# Patient Record
Sex: Female | Born: 1965 | Race: Black or African American | Hispanic: No | Marital: Married | Smoking: Current some day smoker
Health system: Southern US, Community
[De-identification: ages and names within clinical notes are randomized; demographics above are authoritative.]

## PROBLEM LIST (undated history)

## (undated) ENCOUNTER — Ambulatory Visit: Discharge status: Absent without leave | Payer: 59

## (undated) DIAGNOSIS — E039 Hypothyroidism, unspecified: Secondary | ICD-10-CM

## (undated) HISTORY — DX: Hypothyroidism, unspecified: E03.9

---

## 1998-03-06 ENCOUNTER — Encounter: Payer: Self-pay | Admitting: Family Medicine

## 1998-03-06 LAB — CONVERTED CEMR LAB
Blood Glucose, Fasting: 87 mg/dL
RBC count: 13.6 10*6/uL
TSH: 18.25 microintl units/mL
WBC, blood: 4.58 10*3/uL

## 1998-05-03 ENCOUNTER — Encounter: Payer: Self-pay | Admitting: Family Medicine

## 1998-05-03 LAB — CONVERTED CEMR LAB: Pap Smear: ABNORMAL

## 1998-05-17 ENCOUNTER — Other Ambulatory Visit: Admission: RE | Admit: 1998-05-17 | Discharge: 1998-05-17 | Payer: Self-pay | Admitting: Family Medicine

## 1998-05-18 ENCOUNTER — Encounter: Payer: Self-pay | Admitting: Family Medicine

## 1998-05-18 LAB — CONVERTED CEMR LAB: TSH: 14.22 microintl units/mL

## 1998-09-07 ENCOUNTER — Encounter: Payer: Self-pay | Admitting: Family Medicine

## 1998-09-07 LAB — CONVERTED CEMR LAB: Pap Smear: ABNORMAL

## 1998-10-08 ENCOUNTER — Encounter: Payer: Self-pay | Admitting: Family Medicine

## 1998-10-08 LAB — CONVERTED CEMR LAB: TSH: 2 microintl units/mL

## 1998-10-29 HISTORY — PX: PAROTIDECTOMY: SUR1003

## 1999-04-09 ENCOUNTER — Encounter: Payer: Self-pay | Admitting: Family Medicine

## 1999-04-09 LAB — CONVERTED CEMR LAB
Hgb A1c MFr Bld: 5.8 %
TSH: 1.72 microintl units/mL

## 1999-10-11 ENCOUNTER — Other Ambulatory Visit: Admission: RE | Admit: 1999-10-11 | Discharge: 1999-10-11 | Payer: Self-pay | Admitting: Family Medicine

## 1999-10-11 ENCOUNTER — Encounter: Payer: Self-pay | Admitting: Family Medicine

## 1999-10-11 LAB — CONVERTED CEMR LAB: Pap Smear: NORMAL

## 2000-02-02 ENCOUNTER — Emergency Department (HOSPITAL_COMMUNITY): Admission: EM | Admit: 2000-02-02 | Discharge: 2000-02-02 | Payer: Self-pay | Admitting: Emergency Medicine

## 2000-05-12 ENCOUNTER — Encounter: Payer: Self-pay | Admitting: Family Medicine

## 2001-06-11 ENCOUNTER — Encounter: Payer: Self-pay | Admitting: Family Medicine

## 2001-06-11 LAB — CONVERTED CEMR LAB
TSH: 7.75 microintl units/mL
WBC, blood: 7.9 10*3/uL

## 2001-08-13 ENCOUNTER — Encounter: Payer: Self-pay | Admitting: Family Medicine

## 2001-08-13 LAB — CONVERTED CEMR LAB: WBC, blood: 7.6 10*3/uL

## 2002-02-03 ENCOUNTER — Encounter: Payer: Self-pay | Admitting: Family Medicine

## 2002-02-03 LAB — CONVERTED CEMR LAB
RBC count: 4.47 10*6/uL
TSH: 6.83 microintl units/mL
WBC, blood: 7.2 10*3/uL

## 2002-05-06 ENCOUNTER — Encounter: Payer: Self-pay | Admitting: Family Medicine

## 2002-08-19 ENCOUNTER — Encounter: Payer: Self-pay | Admitting: Family Medicine

## 2002-08-19 LAB — CONVERTED CEMR LAB
Blood Glucose, Fasting: 112 mg/dL
TSH: 1.76 microintl units/mL

## 2003-01-11 ENCOUNTER — Emergency Department (HOSPITAL_COMMUNITY): Admission: EM | Admit: 2003-01-11 | Discharge: 2003-01-11 | Payer: Self-pay | Admitting: Emergency Medicine

## 2003-04-08 ENCOUNTER — Emergency Department (HOSPITAL_COMMUNITY): Admission: EM | Admit: 2003-04-08 | Discharge: 2003-04-08 | Payer: Self-pay | Admitting: Emergency Medicine

## 2003-04-08 ENCOUNTER — Encounter: Payer: Self-pay | Admitting: Emergency Medicine

## 2004-05-09 ENCOUNTER — Encounter: Payer: Self-pay | Admitting: Family Medicine

## 2004-05-09 ENCOUNTER — Other Ambulatory Visit: Admission: RE | Admit: 2004-05-09 | Discharge: 2004-05-09 | Payer: Self-pay

## 2004-05-09 LAB — CONVERTED CEMR LAB
Blood Glucose, Fasting: 90 mg/dL
Pap Smear: ABNORMAL
RBC count: 4.28 10*6/uL
TSH: 7.66 microintl units/mL
WBC, blood: 6.3 10*3/uL

## 2004-06-25 ENCOUNTER — Encounter: Payer: Self-pay | Admitting: Family Medicine

## 2004-06-25 LAB — CONVERTED CEMR LAB: TSH: 7.03 u[IU]/mL

## 2004-07-12 HISTORY — PX: ABDOMINAL HYSTERECTOMY: SHX81

## 2004-08-09 ENCOUNTER — Encounter: Payer: Self-pay | Admitting: Family Medicine

## 2004-08-09 LAB — CONVERTED CEMR LAB: RBC count: 4.43 10*6/uL

## 2004-12-06 ENCOUNTER — Ambulatory Visit: Payer: Self-pay | Admitting: Family Medicine

## 2004-12-09 ENCOUNTER — Encounter: Payer: Self-pay | Admitting: Family Medicine

## 2004-12-19 ENCOUNTER — Ambulatory Visit: Payer: Self-pay | Admitting: Family Medicine

## 2005-10-12 ENCOUNTER — Emergency Department (HOSPITAL_COMMUNITY): Admission: EM | Admit: 2005-10-12 | Discharge: 2005-10-12 | Payer: Self-pay | Admitting: Emergency Medicine

## 2005-11-01 ENCOUNTER — Emergency Department: Payer: Self-pay | Admitting: Emergency Medicine

## 2005-12-02 ENCOUNTER — Emergency Department: Payer: Self-pay | Admitting: Emergency Medicine

## 2005-12-11 ENCOUNTER — Ambulatory Visit: Payer: Self-pay | Admitting: Family Medicine

## 2005-12-11 LAB — CONVERTED CEMR LAB: TSH: 3.62 microintl units/mL

## 2005-12-15 ENCOUNTER — Ambulatory Visit: Payer: Self-pay | Admitting: Family Medicine

## 2007-07-26 ENCOUNTER — Emergency Department: Payer: Self-pay | Admitting: Emergency Medicine

## 2008-06-11 ENCOUNTER — Emergency Department: Payer: Self-pay | Admitting: Emergency Medicine

## 2009-04-02 ENCOUNTER — Emergency Department: Payer: Self-pay | Admitting: Emergency Medicine

## 2009-04-05 ENCOUNTER — Encounter: Payer: Self-pay | Admitting: Family Medicine

## 2009-04-05 ENCOUNTER — Ambulatory Visit: Payer: Self-pay | Admitting: Family Medicine

## 2009-04-05 ENCOUNTER — Other Ambulatory Visit: Admission: RE | Admit: 2009-04-05 | Discharge: 2009-04-05 | Payer: Self-pay | Admitting: Family Medicine

## 2009-04-05 DIAGNOSIS — E785 Hyperlipidemia, unspecified: Secondary | ICD-10-CM | POA: Insufficient documentation

## 2009-04-05 DIAGNOSIS — E039 Hypothyroidism, unspecified: Secondary | ICD-10-CM | POA: Insufficient documentation

## 2009-04-05 DIAGNOSIS — R5381 Other malaise: Secondary | ICD-10-CM | POA: Insufficient documentation

## 2009-04-05 DIAGNOSIS — D485 Neoplasm of uncertain behavior of skin: Secondary | ICD-10-CM | POA: Insufficient documentation

## 2009-04-05 DIAGNOSIS — R5383 Other fatigue: Secondary | ICD-10-CM

## 2009-04-05 LAB — CONVERTED CEMR LAB
Basophils Relative: 0.9 % (ref 0.0–3.0)
Eosinophils Relative: 2 % (ref 0.0–5.0)
GFR calc non Af Amer: 87.02 mL/min (ref >60)
Glucose, Bld: 86 mg/dL (ref 70–99)
HCT: 38.5 % (ref 36.0–46.0)
Hemoglobin: 12.9 g/dL (ref 12.0–15.0)
Lymphocytes Relative: 18.8 % (ref 12.0–46.0)
Lymphs Abs: 1.8 10*3/uL (ref 0.7–4.0)
Monocytes Relative: 5.6 % (ref 3.0–12.0)
Neutro Abs: 6.8 10*3/uL (ref 1.4–7.7)
Potassium: 3.9 meq/L (ref 3.5–5.1)
RBC: 4.37 M/uL (ref 3.87–5.11)
RDW: 13 % (ref 11.5–14.6)
Sodium: 141 meq/L (ref 135–145)

## 2009-04-09 ENCOUNTER — Encounter (INDEPENDENT_AMBULATORY_CARE_PROVIDER_SITE_OTHER): Payer: Self-pay | Admitting: *Deleted

## 2009-05-03 ENCOUNTER — Ambulatory Visit: Payer: Self-pay | Admitting: Family Medicine

## 2009-05-09 ENCOUNTER — Ambulatory Visit: Payer: Self-pay | Admitting: Family Medicine

## 2009-11-15 ENCOUNTER — Ambulatory Visit: Payer: Self-pay | Admitting: Family Medicine

## 2009-12-06 ENCOUNTER — Encounter: Payer: Self-pay | Admitting: Family Medicine

## 2010-01-16 ENCOUNTER — Telehealth: Payer: Self-pay | Admitting: Family Medicine

## 2010-03-29 ENCOUNTER — Ambulatory Visit: Payer: Self-pay | Admitting: Family Medicine

## 2010-04-01 LAB — CONVERTED CEMR LAB
Free T4: 0.8 ng/dL (ref 0.6–1.6)
T3, Free: 2.2 pg/mL — ABNORMAL LOW (ref 2.3–4.2)
TSH: 11.35 microintl units/mL — ABNORMAL HIGH (ref 0.35–5.50)

## 2010-04-29 ENCOUNTER — Ambulatory Visit: Payer: Self-pay | Admitting: Family Medicine

## 2010-04-29 LAB — CONVERTED CEMR LAB
Albumin: 3.8 g/dL (ref 3.5–5.2)
Alkaline Phosphatase: 59 units/L (ref 39–117)
Basophils Absolute: 0 10*3/uL (ref 0.0–0.1)
Basophils Relative: 0.5 % (ref 0.0–3.0)
Bilirubin, Direct: 0.1 mg/dL (ref 0.0–0.3)
CO2: 30 meq/L (ref 19–32)
Calcium: 9 mg/dL (ref 8.4–10.5)
Chloride: 105 meq/L (ref 96–112)
Cholesterol: 191 mg/dL (ref 0–200)
Creatinine, Ser: 1 mg/dL (ref 0.4–1.2)
Eosinophils Absolute: 0.3 10*3/uL (ref 0.0–0.7)
Free T4: 1.2 ng/dL (ref 0.6–1.6)
Glucose, Bld: 92 mg/dL (ref 70–99)
HDL: 50.7 mg/dL (ref >39.00)
Lymphocytes Relative: 26.1 % (ref 12.0–46.0)
MCHC: 33.5 g/dL (ref 30.0–36.0)
MCV: 87.6 fL (ref 78.0–100.0)
Monocytes Absolute: 0.4 10*3/uL (ref 0.1–1.0)
Neutro Abs: 5.1 10*3/uL (ref 1.4–7.7)
Neutrophils Relative %: 64.4 % (ref 43.0–77.0)
RBC: 4.43 M/uL (ref 3.87–5.11)
RDW: 14.2 % (ref 11.5–14.6)
Total Protein: 6.7 g/dL (ref 6.0–8.3)
Triglycerides: 85 mg/dL (ref 0.0–149.0)

## 2010-04-30 LAB — CONVERTED CEMR LAB: Vit D, 25-Hydroxy: 20 ng/mL — ABNORMAL LOW (ref 30–89)

## 2010-05-02 ENCOUNTER — Ambulatory Visit: Payer: Self-pay | Admitting: Family Medicine

## 2010-05-02 DIAGNOSIS — J0100 Acute maxillary sinusitis, unspecified: Secondary | ICD-10-CM

## 2010-06-24 ENCOUNTER — Emergency Department: Payer: Self-pay | Admitting: Unknown Physician Specialty

## 2010-07-16 ENCOUNTER — Ambulatory Visit: Payer: Self-pay | Admitting: Family Medicine

## 2010-07-17 LAB — CONVERTED CEMR LAB: TSH: 5.48 microintl units/mL (ref 0.35–5.50)

## 2010-08-01 ENCOUNTER — Encounter (INDEPENDENT_AMBULATORY_CARE_PROVIDER_SITE_OTHER): Payer: Self-pay | Admitting: *Deleted

## 2010-11-15 ENCOUNTER — Emergency Department: Payer: Self-pay | Admitting: Emergency Medicine

## 2010-11-19 ENCOUNTER — Ambulatory Visit: Payer: Self-pay | Admitting: Family Medicine

## 2010-12-17 ENCOUNTER — Ambulatory Visit: Payer: Self-pay | Admitting: Family Medicine

## 2010-12-19 LAB — CONVERTED CEMR LAB
Free T4: 0.93 ng/dL (ref 0.60–1.60)
TSH: 1.04 microintl units/mL (ref 0.35–5.50)

## 2011-01-28 NOTE — Progress Notes (Signed)
Summary: Rx Synthroid  Phone Note Refill Request Message from:  Patient on January 16, 2010 12:55 PM  Refills Requested: Medication #1:  SYNTHROID 112 MCG TABS 1 every  day by mouth. Patient request refill on Synthroid.  Will take her last pill today.  Has not had labs done since 04/2009, last appt 03/2009.  Please advise.  [facility]   Method Requested: Electronic Initial call taken by: Linde Gillis CMA Duncan Dull),  January 16, 2010 12:56 PM  Follow-up for Phone Call        See me during May with Lab prior.  Follow-up by: Shaune Leeks MD,  January 16, 2010 1:23 PM  Additional Follow-up for Phone Call Additional follow up Details #1::        Left message on voicemail.  Advised patient that Rx for Synthroid was sent to pharmacy and that Dr. Hetty Ely would like to see her in May with labs prior.  Advised her to call back and schedule the appt at her convenience.    Additional Follow-up by: Linde Gillis CMA Duncan Dull),  January 16, 2010 2:43 PM    Prescriptions: SYNTHROID 112 MCG TABS (LEVOTHYROXINE SODIUM) 1 every  day by mouth  #90 x 3   Entered and Authorized by:   Shaune Leeks MD   Signed by:   Shaune Leeks MD on 01/16/2010   Method used:   [facility]        [id number]       [address]       [place], [place]  [address]       Ph: [phone] or [phone]       Fax: [phone]   RxID:   [id number]

## 2011-01-28 NOTE — Letter (Signed)
Summary: Shannon Hansen letter  [place] [facility]  [address] [place], [place] [address]   Phone: [phone]  Fax: [phone]       08/01/2010 MRN: [id number]  [facility] [address] [place], [place]  [address]  Dear Ms. Shannon [facility] [place] [place] announce the retirement of Arta Silence, M.D., from full-time practice at the [facility] office effective June 27, 2010 and his plans of returning part-time.  It is important to Dr. Hetty Ely and to our practice that you understand that [facility] has seven physicians in our office for your health care needs.  We will continue to offer the same exceptional care that you have today.    Dr. Hetty Ely has spoken to many of you about his plans for retirement and returning part-time in the fall.   We will continue to work with you through the transition to schedule appointments for you in the office and meet the high standards that [facility] is committed to.   Again, it is with great pleasure that we share the news that Dr. Hetty Ely will return to [facility] in October of 2011 with a reduced schedule.    If you have any questions, or would like to request an appointment with one of our physicians, please call us at [phone] and press the option for Scheduling an appointment.  We take pleasure in providing you with excellent patient care and look forward to seeing you at your next office visit.  Our [facility] Physicians are:  Tillman Abide, M.D. Laurita Quint, M.D. Roxy Manns, M.D. Kerby Nora, M.D. Hannah Beat, M.D. Ruthe Mannan, M.D. We proudly welcomed Raechel Ache, M.D. and Eustaquio Boyden, M.D. to the practice in July/August 2011.  Sincerely,  [facility]

## 2011-01-28 NOTE — Assessment & Plan Note (Signed)
Summary: CANT TASTE OR SMELL   Vital Signs:  Patient profile:   45 year old female Height:      66 inches Weight:      191.25 pounds BMI:     30.98 Temp:     98.3 degrees F oral Pulse rate:   80 / minute Pulse rhythm:   regular BP sitting:   120 / 78  (left arm) Cuff size:   regular  Vitals Entered By: Delilah Shan CMA Duncan Dull) (March 29, 2010 9:03 AM) CC: Can't taste or smell.    History of Present Illness: 45 yo here with congestion x 2 weeks. Started out with sinus congestion, not really draining, just feeling congested. Last few days feels she is so congested she cannot taste or smell things like she normally does. No fevers or chills. Tickle in her throat and laryngitis, but no cough. No SOB or wheezing.    prone to vaginal yeast infections with abx.  Hypothyroidism- brings her labs from work, taken in December, TSH was 10.42.  Currently taking Synthroid 112 micrograms and wants to make sure that her thyroid is ok.  Has been feeling a little more fatigued and has gained weight.  No changes in bowels.  Mood has been a little low,but she says she is not depressed.    Current Medications (verified): 1)  Synthroid 112 Mcg Tabs (Levothyroxine Sodium) .Marland Kitchen.. 1 Every  Day By Mouth 2)  Amoxicillin 500 Mg Tabs (Amoxicillin) .Marland Kitchen.. 1 Tab By Mouth Two Times A Day X 10 Days 3)  Diflucan 150 Mg Tabs (Fluconazole) .... Once Daily  Allergies (verified): No Known Drug Allergies  Review of Systems      See HPI General:  Denies chills and fever. ENT:  Complains of nasal congestion, sinus pressure, and sore throat; denies difficulty swallowing. CV:  Denies chest pain or discomfort. Resp:  Denies cough. GI:  Denies abdominal pain. Derm:  Denies rash. Psych:  Denies anxiety, depression, suicidal thoughts/plans, thoughts of violence, unusual visions or sounds, and thoughts /plans of harming others.  Physical Exam  General:  Well-developed,well-nourished,in no acute distress;  alert,appropriate and cooperative throughout examination, slightly overwieght. Eyes:  Conjunctiva clear bilaterally.  Ears:  mod clear fluid bilaterally Nose:  mucosal erythema and mucosal edema.   no airflow obstruciton. sinuses +/- tender Mouth:  pharyngeal erythema.   Lungs:  Normal respiratory effort, chest expands symmetrically. Lungs are clear to auscultation, no crackles or wheezes. Heart:  Normal rate and regular rhythm. S1 and S2 normal without gallop, murmur, click, rub or other extra sounds. Extremities:  No clubbing, cyanosis, edema, or deformity noted with normal full range of motion of all joints.   Psych:  Cognition and judgment appear intact. Alert and cooperative with normal attention span and concentration. No apparent delusions, illusions, hallucinations   Impression & Recommendations:  Problem # 1:  OTHER ACUTE SINUSITIS (ICD-461.8) Assessment New Given duration and progression of symptoms, will treat with 10 day course of amoxicllin.  See pt instructions for details. Her updated medication list for this problem includes:    Amoxicillin 500 Mg Tabs (Amoxicillin) .Marland Kitchen... 1 tab by mouth two times a day x 10 days  Problem # 2:  HYPOTHYROIDISM (ICD-244.9) Assessment: Deteriorated Will recheck Thyroid studies.  Continue current dose for now.  will scan labs from her work. Her updated medication list for this problem includes:    Synthroid 112 Mcg Tabs (Levothyroxine sodium) .Marland Kitchen... 1 every  day by mouth  Orders: Venipuncture (04540)  TLB-TSH (Thyroid Stimulating Hormone) (84443-TSH) TLB-T3, Free (Triiodothyronine) (84481-T3FREE) TLB-T4 (Thyrox), Free [phone])  Complete Medication List: 1)  Synthroid 112 Mcg Tabs (Levothyroxine sodium) .Marland Kitchen.. 1 every  day by mouth 2)  Amoxicillin 500 Mg Tabs (Amoxicillin) .Marland Kitchen.. 1 tab by mouth two times a day x 10 days 3)  Diflucan 150 Mg Tabs (Fluconazole) .... Once daily  Patient Instructions: 1)  Take amoxicillin as directed.  Drink  lots of fluids.  Treat sympotmatically with Mucinex, nasal saline irrigation, and Tylenol/Ibuprofen. Also try claritin D or zyrtec D over the counter- two times a day as needed ( have to sign for them at pharmacy). You can use warm compresses.  Call if not improving as expected in 5-7 days.  Prescriptions: DIFLUCAN 150 MG TABS (FLUCONAZOLE) once daily  #1 x 0   Entered and Authorized by:   Ruthe Mannan MD   Signed by:   Ruthe Mannan MD on 03/29/2010   Method used:   [facility]        [id number]       [address]       [place], [place]  [address]       Ph: [phone] or [phone]       Fax: [phone]   RxID:   [id number] AMOXICILLIN 500 MG TABS (AMOXICILLIN) 1 tab by mouth two times a day x 10 days  #20 x 0   Entered and Authorized by:   Ruthe Mannan MD   Signed by:   Ruthe Mannan MD on 03/29/2010   Method used:   [facility]        [id number]       [address]       [place], [place]  [address]       Ph: [phone] or [phone]       Fax: [phone]   RxID:   [id number]   Current Allergies (reviewed today): No known allergies

## 2011-01-28 NOTE — Assessment & Plan Note (Signed)
Summary: CPX/CLE   Vital Signs:  Patient profile:   45 year old female Weight:      192.50 pounds Temp:     99.0 degrees F oral Pulse rate:   80 / minute Pulse rhythm:   regular BP sitting:   108 / 72  (left arm) Cuff size:   regular  Vitals Entered By: Sydell Axon LPN (May 03, 5783 8:13 AM) CC: 30 Minute checkup, has had a hysterectomy   History of Present Illness: Pt here for Comp Exam. She had Hyst for fibroids. She had one abnml Pap in the past. She still has her ovaries and thought she might have had a problem. He left her ovaries so assumed they were ok.  She has no complaints except pain in the neck area. Her taste came back but now is gone again. She has had a place on her rectal area that swells at times and is sore and then spontaneously drains...curr decompressed. Has been going on for a few months. Pt says she had mammo in Feb, no evidence in chart.  Preventive Screening-Counseling & Management  Alcohol-Tobacco     Alcohol drinks/day: <1     Alcohol type: vodka and OJ     Smoking Status: never     Smoking Cessation Counseling: yes     Packs/Day: <0.25  Caffeine-Diet-Exercise     Caffeine use/day: >5     Does Patient Exercise: yes     Type of exercise: walking and gym     Exercise (avg: min/session): 30-60     Times/week: 3  Problems Prior to Update: 1)  Other Acute Sinusitis  (ICD-461.8) 2)  Hyperlipidemia  (ICD-272.4) 3)  Health Maintenance Exam  (ICD-V70.0) 4)  Neoplasm of Uncertain Behavior of Skin  (ICD-238.2) 5)  Fatigue  (ICD-780.79) 6)  Hypothyroidism  (ICD-244.9)  Medications Prior to Update: 1)  Diflucan 150 Mg Tabs (Fluconazole) .... Once Daily 2)  Synthroid 125 Mcg Tabs (Levothyroxine Sodium) .Marland Kitchen.. 1 Tab By Mouth Daily  Allergies: No Known Drug Allergies  Past History:  Past Surgical History: Last updated: 04/05/2009 PARTIAL HYSTERECTOMY ;DYSMENNORHEA :( DR. Max Sane) 07/12/2004 LEFT PAROTIDECTOMY (DR CERAME)  (10/1998)  Family  History: Last updated: 05/02/2010 Father A 71 LABILE HTN   DM   THYR DZ   CHOL  (Coy Saunas) Mother: A 68  PALPITATIONS Brother A 50 Brother A 49 DM Brother A 48 Sister A 47 CV: NEGATIVE HBP: POSITIVE FATHER DM: POSITIVE FATHER GOUT/ARTHRITIS: PROSTATE CANCER BREAST/OVARIAN/UTERINE CANCER: POSITIVE MGM// AND AUNT/ PANCREAS AND LIVER CANCER ALSO COLON CANCER: POSITVE LIVER AND PANCREAS AUNT DEPRESSION: NEGATIVE ETOH/DRUG ABUSE:NEGATIVE ETOH/DRUG ABUSE:: NEGATIVE OTHER: NEGATIVE STROKE  Social History: Last updated: 04/05/2009 Marital Status Children: 1 DAUGHTER Occupation: MED LAB TECH  Risk Factors: Alcohol Use: <1 (05/02/2010) Caffeine Use: >5 (05/02/2010) Exercise: yes (05/02/2010)  Risk Factors: Smoking Status: never (05/02/2010) Packs/Day: <0.25 (05/02/2010)  Family History: Father A 71 LABILE HTN   DM   THYR DZ   CHOL  ([employer]) Mother: A 68  PALPITATIONS Brother A 50 Brother A 49 DM Brother A 48 Sister A 47 CV: NEGATIVE HBP: POSITIVE FATHER DM: POSITIVE FATHER GOUT/ARTHRITIS: PROSTATE CANCER BREAST/OVARIAN/UTERINE CANCER: POSITIVE MGM// AND AUNT/ PANCREAS AND LIVER CANCER ALSO COLON CANCER: POSITVE LIVER AND PANCREAS AUNT DEPRESSION: NEGATIVE ETOH/DRUG ABUSE:NEGATIVE ETOH/DRUG ABUSE:: NEGATIVE OTHER: NEGATIVE STROKE  Social History: Caffeine use/day:  >5  Review of Systems General:  Complains of fatigue and sweats; denies chills, fever, weakness, and weight loss; night sweats and  occas fatigue. Eyes:  Denies blurring, discharge, and eye pain. ENT:  Denies decreased hearing, ear discharge, earache, and ringing in ears. CV:  Denies chest pain or discomfort, fainting, fatigue, palpitations, shortness of breath with exertion, swelling of feet, and swelling of hands. Resp:  Denies cough, shortness of breath, and wheezing. GI:  Denies abdominal pain, bloody stools, change in bowel habits, constipation, dark tarry stools, diarrhea, indigestion,  loss of appetite, nausea, vomiting, vomiting blood, and yellowish skin color. GU:  Denies discharge, dysuria, nocturia, and urinary frequency. MS:  Complains of muscle aches; denies joint pain, low back pain, cramps, and stiffness; neck. Derm:  Denies dryness, itching, and rash. Neuro:  Denies numbness, poor balance, tingling, and tremors.  Physical Exam  General:  Well-developed,well-nourished,in no acute distress; alert,appropriate and cooperative throughout examination, slightly overwieght. Head:  Normocephalic and atraumatic without obvious abnormalities. No apparent alopecia or balding. Sinuses minimally tender max. Eyes:  Conjunctiva clear bilaterally.  Ears:  External ear exam shows no significant lesions or deformities.  Otoscopic examination reveals clear canals, tympanic membranes are intact bilaterally without bulging, retraction, inflammation or discharge. Hearing is grossly normal bilaterally. Nose:  mucosal erythema and mucosal edema.   no airflow obstruciton. sinuses +/- tender max Mouth:  Oral mucosa and oropharynx without lesions or exudates.  Teeth in good repair. Neck:  No deformities, masses, or tenderness noted. Chest Wall:  No deformities, masses, or tenderness noted. Breasts:  No mass, nodules, thickening, tenderness, bulging, retraction, inflamation, nipple discharge or skin changes noted.   Lungs:  Normal respiratory effort, chest expands symmetrically. Lungs are clear to auscultation, no crackles or wheezes. Heart:  Normal rate and regular rhythm. S1 and S2 normal without gallop, murmur, click, rub or other extra sounds. Abdomen:  Bowel sounds positive,abdomen soft and non-tender without masses, organomegaly or hernias noted. Rectal:  No external abnormalities noted. Normal sphincter tone. No rectal masses or tenderness. G neg. Genitalia:  Bimanual only done Introitus wnl, Uterus and Cervix absent, Adnexa nontender w/o mass, ovaries not felt.  Msk:  No deformity  or scoliosis noted of thoracic or lumbar spine.   Pulses:  R and L carotid,radial,femoral,dorsalis pedis and posterior tibial pulses are full and equal bilaterally Extremities:  No clubbing, cyanosis, edema, or deformity noted with normal full range of motion of all joints.   Neurologic:  No cranial nerve deficits noted. Station and gait are normal. Sensory, motor and coordinative functions appear intact. Skin:  Intact without suspicious lesions or rashes except small maculopapular 8mm lesion on left perineal area just posterior tyo rectal/anal verge, decompressed at present.   Impression & Recommendations:  Problem # 1:  HEALTH MAINTENANCE EXAM (ICD-V70.0) Assessment Comment Only Discusssed immun, wellness checks and Pap and Mammos.  Problem # 2:  ABSCESS, PERINEAL (ICD-682.9) Assessment: New  Use heat as often as poss. Start AB. Her updated medication list for this problem includes:    Augmentin 500-125 Mg Tabs (Amoxicillin-pot clavulanate) ..... One tab by mouth three times a day for 2 weeks  Elevate affected area. Warm moist compresses for 20 minutes every 2 hours while awake. Take antibiotics as directed and take acetaminophen as needed. To be seen in 48-72 hours if no improvement, sooner if worse.  Problem # 3:  HYPERLIPIDEMIA (ICD-272.4) Assessment: Unchanged  STable. Weight loss will help. She is planning on starting.  Labs Reviewed: SGOT: 14 (04/29/2010)   SGPT: 12 (04/29/2010)   HDL:50.70 (04/29/2010)  LDL:123 (04/29/2010)  Chol:191 (04/29/2010)  Trig:85.0 (04/29/2010)  Problem # 4:  FATIGUE (ICD-780.79) Assessment: Unchanged No lab evidence. Start exercise.  Problem # 5:  HYPOTHYROIDISM (ICD-244.9) Assessment: Unchanged Stable and adequately replaced. Cont curr dose. Her updated medication list for this problem includes:    Synthroid 125 Mcg Tabs (Levothyroxine sodium) .Marland Kitchen... 1 tab by mouth daily  Labs Reviewed: TSH: 4.41 (04/29/2010)    HgBA1c: 5.8  (04/09/1999) Chol: 191 (04/29/2010)   HDL: 50.70 (04/29/2010)   LDL: 123 (04/29/2010)   TG: 85.0 (04/29/2010)  Complete Medication List: 1)  Synthroid 125 Mcg Tabs (Levothyroxine sodium) .Marland Kitchen.. 1 tab by mouth daily 2)  Augmentin 500-125 Mg Tabs (Amoxicillin-pot clavulanate) .... One tab by mouth three times a day for 2 weeks 3)  Diflucan 100 Mg Tabs (Fluconazole) .... One tab in 7 days another at end of abs  Patient Instructions: 1)  RTC if sxs don't resolve. Prescriptions: SYNTHROID 125 MCG TABS (LEVOTHYROXINE SODIUM) 1 tab by mouth daily  #30 x 12   Entered and Authorized by:   Shaune Leeks MD   Signed by:   Shaune Leeks MD on 05/02/2010   Method used:   [facility]        [id number]       [address]       [place], [place]  [address]       Ph: [phone] or [phone]       Fax: [phone]   RxID:   [id number] DIFLUCAN 100 MG TABS (FLUCONAZOLE) one tab in 7 days another at end of Abs  #2 x 0   Entered and Authorized by:   Shaune Leeks MD   Signed by:   Shaune Leeks MD on 05/02/2010   Method used:   [facility]        [id number]       [address]       [place], [place]  [address]       Ph: [phone] or [phone]       Fax: [phone]   RxID:   [id number] AUGMENTIN 500-125 MG TABS (AMOXICILLIN-POT CLAVULANATE) one tab by mouth three times a day for 2 weeks  #42 x 0   Entered and Authorized by:   Shaune Leeks MD   Signed by:   Shaune Leeks MD on 05/02/2010   Method used:   [facility]        [id number]       [address]       [place], [place]  [address]       Ph: [phone] or [phone]       Fax: [phone]   RxID:   [id number]   Current Allergies (reviewed today): No known allergies

## 2011-03-10 ENCOUNTER — Encounter: Payer: Self-pay | Admitting: Family Medicine

## 2011-03-18 NOTE — Letter (Signed)
Summary: [facility]   Imported By: Lanelle Bal 03/13/2011 13:04:55  _____________________________________________________________________  External Attachment:    Type:   Image     Comment:   External Document

## 2011-06-02 ENCOUNTER — Other Ambulatory Visit: Payer: Self-pay | Admitting: *Deleted

## 2011-06-02 MED ORDER — LEVOTHYROXINE SODIUM 125 MCG PO TABS: 125.0000 ug | ORAL_TABLET | Freq: Every day | ORAL | Status: DC

## 2011-06-26 ENCOUNTER — Ambulatory Visit (INDEPENDENT_AMBULATORY_CARE_PROVIDER_SITE_OTHER): Payer: PRIVATE HEALTH INSURANCE | Admitting: Family Medicine

## 2011-06-26 ENCOUNTER — Encounter: Payer: Self-pay | Admitting: Family Medicine

## 2011-06-26 VITALS — BP 116/76 | HR 80 | Temp 98.5°F | Ht 66.0 in | Wt 202.0 lb

## 2011-06-26 DIAGNOSIS — R3129 Other microscopic hematuria: Secondary | ICD-10-CM | POA: Insufficient documentation

## 2011-06-26 DIAGNOSIS — E039 Hypothyroidism, unspecified: Secondary | ICD-10-CM

## 2011-06-26 DIAGNOSIS — S335XXA Sprain of ligaments of lumbar spine, initial encounter: Secondary | ICD-10-CM

## 2011-06-26 DIAGNOSIS — M549 Dorsalgia, unspecified: Secondary | ICD-10-CM

## 2011-06-26 DIAGNOSIS — R319 Hematuria, unspecified: Secondary | ICD-10-CM

## 2011-06-26 DIAGNOSIS — S39012A Strain of muscle, fascia and tendon of lower back, initial encounter: Secondary | ICD-10-CM

## 2011-06-26 LAB — POCT URINALYSIS DIPSTICK
Bilirubin, UA: NEGATIVE
Ketones, UA: NEGATIVE
Leukocytes, UA: NEGATIVE
pH, UA: 5

## 2011-06-26 MED ORDER — CYCLOBENZAPRINE HCL 5 MG PO TABS: 5.0000 mg | ORAL_TABLET | Freq: Two times a day (BID) | ORAL | Status: DC | PRN

## 2011-06-26 MED ORDER — NAPROXEN 500 MG PO TABS: ORAL_TABLET | ORAL | Status: DC

## 2011-06-26 MED ORDER — LEVOTHYROXINE SODIUM 150 MCG PO TABS: 150.0000 ug | ORAL_TABLET | Freq: Every day | ORAL | Status: DC

## 2011-06-26 NOTE — Assessment & Plan Note (Signed)
TSH elevated at work [employer].  Asked to scan into system. Endorsing increased appetite, fatigue, and weight gain. Increase synthroid to daily, previous dose was daily. RTC 6 wks for recheck blood work.

## 2011-06-26 NOTE — Progress Notes (Signed)
  Subjective:    Patient ID: Shannon Hansen, female    DOB: [birth year], 45 y.o.   MRN: [id number]  HPI CC: back pain, review test results from work.  Brings copy of blood work recently, showing elevated TSH to 13, as well as A1c of 6.2%.  Endorses appetite increase and recent weight gain.  Attributes to both thyroid and decreased activity.  Recently got married.   Wt Readings from Last 3 Encounters:  06/26/11 202 lb (91.627 kg)  05/02/10 192 lb 8 oz (87.317 kg)  03/29/10 191 lb 4 oz (86.75 kg)   Today presents with lower back pain, soreness as well as muscle spasm.  Going on for 3-4 days.  Sometimes radiates to abd bilaterally.  Works 3rd shift.  Didn't work last night 2/2 pain.  Would like excuse for work.  Does lots of turning with back at work.  No denies radiculopathy, paresthesias, or fevers/chills, or weakness.  Denies trauma/injury to start back pain.  Did have boil that drained last week and seems to be resolving.  Hematuria - no h/o blood previously, hasn't noted blood in urine.  Denies dysuria, urgency, frequency, nausea/vomiting.  Pain not colicky/crampy.  Not had kidney stones in past.  Never smoked.  No exposure to fumes.  Review of Systems Per HPI    Objective:   Physical Exam  Nursing note and vitals reviewed. Constitutional: She appears well-developed and well-nourished. No distress.  HENT:  Head: Normocephalic and atraumatic.  Mouth/Throat: Oropharynx is clear and moist. No oropharyngeal exudate.  Neck: Normal range of motion. Neck supple.  Cardiovascular: Normal rate, regular rhythm, normal heart sounds and intact distal pulses.   No murmur heard. Pulmonary/Chest: Effort normal and breath sounds normal. No respiratory distress. She has no wheezes. She has no rales.  Abdominal: Soft. Bowel sounds are normal. She exhibits no distension. There is no hepatosplenomegaly. There is no tenderness. There is no rebound and no CVA tenderness.  Musculoskeletal: She exhibits no  edema.       Thoracic back: Normal.       Midline tenderness lower thoracic and lumbar spine. + paraspinous mm tenderness as well. No pain GTB, mild pain SIJ bilaterally. Neg SLR test.  Lymphadenopathy:    She has no cervical adenopathy.  Neurological: She has normal strength. No sensory deficit.  Reflex Scores:      Patellar reflexes are 2+ on the right side and 2+ on the left side.      Achilles reflexes are 2+ on the right side and 2+ on the left side. Skin: Skin is warm and dry. No rash noted.  Psychiatric: She has a normal mood and affect.          Assessment & Plan:

## 2011-06-26 NOTE — Assessment & Plan Note (Addendum)
anticipate lumbar strain.  No red flags. Treat conservatively with ice/heat, rest, NSAIDs, and flexeril.  Update if not improving as expected. Provided with SM patient advisor handout on lower back pain

## 2011-06-26 NOTE — Patient Instructions (Signed)
For back - likely lumbar strain.  Use prescribed meds as well as stretching exercises provided.  May also do ice/heat to back - whichever soothes better. Not too much blood in urine when looked at under microscope.  May repeat next visit. For thyroid - increase synthroid to daily, return in 6 weeks for recheck. Call us if not improving as expected.  Work excuse provided today.

## 2011-06-26 NOTE — Assessment & Plan Note (Signed)
Nonsmoker.  No family hx bladder cancer.  Denies printing fume exposure. On micro reassuring, RBC 0-3/hpf. Monitor for now, consider rpt next visit.

## 2011-06-27 ENCOUNTER — Emergency Department: Payer: Self-pay | Admitting: Emergency Medicine

## 2011-07-07 ENCOUNTER — Ambulatory Visit (INDEPENDENT_AMBULATORY_CARE_PROVIDER_SITE_OTHER): Payer: PRIVATE HEALTH INSURANCE | Admitting: Family Medicine

## 2011-07-07 ENCOUNTER — Encounter: Payer: Self-pay | Admitting: *Deleted

## 2011-07-07 ENCOUNTER — Encounter: Payer: Self-pay | Admitting: Family Medicine

## 2011-07-07 VITALS — BP 130/84 | HR 64 | Temp 98.2°F | Wt 203.8 lb

## 2011-07-07 DIAGNOSIS — M25572 Pain in left ankle and joints of left foot: Secondary | ICD-10-CM | POA: Insufficient documentation

## 2011-07-07 DIAGNOSIS — M25579 Pain in unspecified ankle and joints of unspecified foot: Secondary | ICD-10-CM

## 2011-07-07 NOTE — Progress Notes (Signed)
  Subjective:    Patient ID: Shannon Hansen, female    DOB: 12-Jun-1966, 45 y.o.   MRN: [id number]  HPI  45 yo new to me here for ER follow up.  Notes reviewed.  Was seen at [facility] on 06/27/2011 left ankle pain after she inverted her left ankle while walking. Immediately heard something pop and top of her left foot and ankle swelled.  DXR of left ankle- showed no acute bony abnormalities with some soft tissue swelling laterally.  Advised that she had an ankle sprain and ice, rest, and elevation.  Swelling has improved a little.     Patient Active Problem List  Diagnoses  . NEOPLASM OF UNCERTAIN BEHAVIOR OF SKIN  . HYPOTHYROIDISM  . HYPERLIPIDEMIA  . FATIGUE  . Lumbar strain  . Hematuria  . Left ankle pain   Past Medical History  Diagnosis Date  . Hypothyroid    Past Surgical History  Procedure Date  . Abdominal hysterectomy 07/12/2004    partial hyst dysmennorhea Dr Max Sane  . Parotidectomy [id number]    left/ Dr Renaee Munda   History  Substance Use Topics  . Smoking status: Never Smoker   . Smokeless tobacco: Not on file  . Alcohol Use: Not on file   Family History  Problem Relation Age of Onset  . Hypertension Mother     labile HTN  . Diabetes Mother   . Thyroid disease Mother   . Hyperlipidemia Mother   . Heart disease Father     palpitations  . Diabetes Brother   . Cancer Maternal Grandmother     breast/ovarian.uterine CA  . Cancer Maternal Aunt     liver/pancreas cancer   No Known Allergies Current Outpatient Prescriptions on File Prior to Visit  Medication Sig Dispense Refill  . Acetaminophen-Aspirin Buffered (EXCEDRIN BACK & BODY PO) Take by mouth as directed.        . cyclobenzaprine (FLEXERIL) 5 MG tablet Take 1 tablet (5 mg total) by mouth 2 (two) times daily as needed for muscle spasms.  30 tablet  1  . levothyroxine (SYNTHROID, LEVOTHROID) 150 MCG tablet Take 1 tablet (150 mcg total) by mouth daily.  30 tablet  6  . naproxen (NAPROSYN) 500 MG tablet  Take one po bid x 1 week then prn pain, take with food  60 tablet  0   The PMH, PSH, Social History, Family History, Medications, and allergies have been reviewed in [facility], and have been updated if relevant.   Review of Systems See HPI    Objective:   Physical Exam There were no vitals taken for this visit. Gen:  Alert, pleasant NAD MSK: + swelling top of left foot and ankle, non tender to palpation of fibula, but she is tender to palp over 5th metatarsal of lateral foot and ant talofib ligament. Very painful with talar tilt test Neuro:  Limping, otherwise grossly intact Psych:  Pleasant, good eye contact, not anxious or depressed appearing.      Assessment & Plan:   1. Left ankle pain   New. Xrays neg. I am concerned that although no fx on xray that she has significant disruption of her ligament. Will refer to ortho immediately for boot placement and further work up, i.e MRI. The patient indicates understanding of these issues and agrees with the plan.

## 2011-07-07 NOTE — Patient Instructions (Signed)
Please stop by to see Shannon Hansen on your way out. 

## 2011-07-10 ENCOUNTER — Encounter: Payer: Self-pay | Admitting: Family Medicine

## 2011-08-07 ENCOUNTER — Other Ambulatory Visit (INDEPENDENT_AMBULATORY_CARE_PROVIDER_SITE_OTHER): Payer: PRIVATE HEALTH INSURANCE | Admitting: Family Medicine

## 2011-08-07 DIAGNOSIS — E039 Hypothyroidism, unspecified: Secondary | ICD-10-CM

## 2011-08-07 LAB — TSH: TSH: 0.53 u[IU]/mL (ref 0.35–5.50)

## 2011-12-30 HISTORY — PX: OTHER SURGICAL HISTORY: SHX169

## 2012-01-05 ENCOUNTER — Emergency Department: Payer: Self-pay | Admitting: Emergency Medicine

## 2012-01-27 ENCOUNTER — Other Ambulatory Visit: Payer: Self-pay | Admitting: Family Medicine

## 2012-01-28 ENCOUNTER — Ambulatory Visit: Payer: PRIVATE HEALTH INSURANCE | Admitting: Family Medicine

## 2012-02-01 ENCOUNTER — Encounter: Payer: Self-pay | Admitting: Family Medicine

## 2012-03-04 ENCOUNTER — Ambulatory Visit (INDEPENDENT_AMBULATORY_CARE_PROVIDER_SITE_OTHER): Payer: PRIVATE HEALTH INSURANCE | Admitting: Family Medicine

## 2012-03-04 ENCOUNTER — Encounter: Payer: Self-pay | Admitting: Family Medicine

## 2012-03-04 VITALS — BP 128/82 | HR 76 | Temp 98.5°F | Wt 214.0 lb

## 2012-03-04 DIAGNOSIS — S161XXA Strain of muscle, fascia and tendon at neck level, initial encounter: Secondary | ICD-10-CM | POA: Insufficient documentation

## 2012-03-04 DIAGNOSIS — S139XXA Sprain of joints and ligaments of unspecified parts of neck, initial encounter: Secondary | ICD-10-CM

## 2012-03-04 MED ORDER — CYCLOBENZAPRINE HCL 10 MG PO TABS: 10.0000 mg | ORAL_TABLET | Freq: Two times a day (BID) | ORAL | Status: AC | PRN

## 2012-03-04 MED ORDER — NAPROXEN 500 MG PO TABS: ORAL_TABLET | ORAL | Status: DC

## 2012-03-04 NOTE — Progress Notes (Signed)
  Subjective:    Patient ID: Shannon Hansen, female    DOB: 1966/03/14, 46 y.o.   MRN: [id number]  HPI CC: back pain/burning  3 1/2 yr h/o right sided back pain at neck - tingling and paresthesias for last 2 weeks.  Worsened with work activities - lots of lifting and pulling at work.  Today starting having pain down right arm.  Thinks carries stress in neck.  Denies numbness, weakness, fevers/chills, rashes.  Also intermittent itching on back.  Started years ago.  Staying about same.    So far tried ibuprofen OTC which helps off and on.  Has tried muscle relaxants as well.  Fall in January 2013 - walking on work table, missed step, fell onto right shoulder.  Lives in 1980s house, worried about lead.  Reassured.  Review of Systems Per HPI    Objective:   Physical Exam  Nursing note and vitals reviewed. Constitutional: She appears well-developed and well-nourished. No distress.  Musculoskeletal:       No midline spine tenderness. Neg spurling, FROM at neck. + trap mm tenderness to palpation bilaterally. No pain with int/ ext rotation against resistance. FROM at shoulders.  No pain with palpation of shoulder or upper arm.  Neurological: She is alert. She has normal strength and normal reflexes. No sensory deficit. She exhibits normal muscle tone.  Reflex Scores:      Bicep reflexes are 2+ on the right side and 2+ on the left side.      Brachioradialis reflexes are 2+ on the right side and 2+ on the left side. Skin: Skin is warm and dry. No rash noted.  Psychiatric: She has a normal mood and affect.       Assessment & Plan:

## 2012-03-04 NOTE — Patient Instructions (Signed)
I think you have muscle strain in neck muscles. Treat with anti inflammatory and muscle relaxant (caution it may make you sleepy - if too sleepy cut flexeril in 1/2). Stretching exercises provided today. May also do ice/heat to back/neck. Call us with questions or if not improving as expected.

## 2012-03-04 NOTE — Assessment & Plan Note (Addendum)
No red flags. Anticipate cervical strain.  No significant radiculopathy on exam today. Treat with flexeril and nsaids.  Sedation precautions discussed, avoidance of other nsaids discussed. Update [place] if not improving as expected. For itching - exam normal.  rec benadryl or sarna cream.  Longstanding. No vesicular rash.

## 2012-03-05 ENCOUNTER — Ambulatory Visit: Payer: PRIVATE HEALTH INSURANCE | Admitting: Family Medicine

## 2012-04-19 ENCOUNTER — Other Ambulatory Visit: Payer: Self-pay | Admitting: *Deleted

## 2012-04-19 MED ORDER — LEVOTHYROXINE SODIUM 150 MCG PO TABS: 150.0000 ug | ORAL_TABLET | Freq: Every day | ORAL | Status: DC

## 2012-04-19 NOTE — Telephone Encounter (Signed)
Received a faxed refill request from pharmacy requesting a 90 days supply on Levothyroxine. Refill sent electronically to pharmacy.

## 2012-12-29 DIAGNOSIS — E039 Hypothyroidism, unspecified: Secondary | ICD-10-CM

## 2012-12-29 HISTORY — DX: Hypothyroidism, unspecified: E03.9

## 2012-12-29 HISTORY — PX: BREAST BIOPSY: SHX20

## 2013-01-13 ENCOUNTER — Other Ambulatory Visit: Payer: Self-pay | Admitting: Family Medicine

## 2013-01-19 ENCOUNTER — Ambulatory Visit: Payer: PRIVATE HEALTH INSURANCE | Admitting: Family Medicine

## 2013-01-27 ENCOUNTER — Ambulatory Visit: Payer: PRIVATE HEALTH INSURANCE | Admitting: Family Medicine

## 2013-02-16 ENCOUNTER — Ambulatory Visit (INDEPENDENT_AMBULATORY_CARE_PROVIDER_SITE_OTHER): Payer: Self-pay | Admitting: Family Medicine

## 2013-02-16 ENCOUNTER — Other Ambulatory Visit: Payer: Self-pay | Admitting: Family Medicine

## 2013-02-16 ENCOUNTER — Encounter: Payer: Self-pay | Admitting: Family Medicine

## 2013-02-16 VITALS — BP 124/84 | HR 76 | Temp 98.2°F | Wt 213.2 lb

## 2013-02-16 DIAGNOSIS — R32 Unspecified urinary incontinence: Secondary | ICD-10-CM | POA: Insufficient documentation

## 2013-02-16 DIAGNOSIS — R319 Hematuria, unspecified: Secondary | ICD-10-CM

## 2013-02-16 DIAGNOSIS — E039 Hypothyroidism, unspecified: Secondary | ICD-10-CM

## 2013-02-16 LAB — POCT URINALYSIS DIPSTICK
Bilirubin, UA: NEGATIVE
Glucose, UA: NEGATIVE
Leukocytes, UA: NEGATIVE
Nitrite, UA: NEGATIVE

## 2013-02-16 LAB — TSH: TSH: 0.08 u[IU]/mL — ABNORMAL LOW (ref 0.35–5.50)

## 2013-02-16 MED ORDER — LEVOTHYROXINE SODIUM 137 MCG PO TABS: 137.0000 ug | ORAL_TABLET | Freq: Every day | ORAL | Status: DC

## 2013-02-16 NOTE — Progress Notes (Signed)
Subjective:    Patient ID: Shannon Hansen, female    DOB: [birth year], 47 y.o.   MRN: [id number]  HPI CC: thyroid check  I asked Shannon Hansen to come in today to recheck thyroid.  Endorses occasional cold intolerance.  Dry and itchy skin.  No hair changes.  No diarrhea.  Some constipation.  States 3 mo ago - dysuria, blood in urine, took some left over abx from husband which cleared sxs.  Persistent leaking with walking, coughing, sneezing.  S/p hysterectomy 2005.  Ovaries remained.  Continued foot pain - sees podiatrist for this.  Has had xrays done.  Told pain from old age.  Some R forearm soreness. H/o L TMJ.  Lost job, no insurance.  Wt Readings from Last 3 Encounters:  02/16/13 213 lb 4 oz (96.73 kg)  03/04/12 214 lb (97.07 kg)  07/07/11 203 lb 12 oz (92.42 kg)    Preentative: Last CPE unsure mammogram - per pt normal 2013.  Will bring me copy of report.  Gets yearly Cervical cancer screening - s/p hysterectomy 2005, ovaries remain.  Medications and allergies reviewed and updated in chart.  Past histories reviewed and updated if relevant as below. Patient Active Problem List  Diagnosis  . NEOPLASM OF UNCERTAIN BEHAVIOR OF SKIN  . HYPOTHYROIDISM  . HYPERLIPIDEMIA  . FATIGUE  . Lumbar strain  . Hematuria  . Left ankle pain  . Cervical strain   Past Medical History  Diagnosis Date  . Hypothyroid    Past Surgical History  Procedure Laterality Date  . Abdominal hysterectomy  07/12/2004    partial hyst dysmennorhea Dr Max Sane  . Parotidectomy  [id number]    left/ Dr Renaee Munda  . [facility] er eval  2013    normal elbow, L hip, R hand and l-spine xrays   History  Substance Use Topics  . Smoking status: Never Smoker   . Smokeless tobacco: Not on file  . Alcohol Use: No   Family History  Problem Relation Age of Onset  . Hypertension Mother     labile HTN  . Diabetes Mother   . Thyroid disease Mother   . Hyperlipidemia Mother   . Heart disease Father     palpitations   . Diabetes Brother   . Cancer Maternal Grandmother     breast/ovarian.uterine CA  . Cancer Maternal Aunt     liver/pancreas cancer   No Known Allergies Current Outpatient Prescriptions on File Prior to Visit  Medication Sig Dispense Refill  . Acetaminophen-Aspirin Buffered (EXCEDRIN BACK & BODY PO) Take by mouth as directed.        Marland Kitchen levothyroxine (SYNTHROID, LEVOTHROID) 150 MCG tablet TAKE 1 TABLET BY MOUTH DAILY.  30 tablet  3   No current facility-administered medications on file prior to visit.    Review of Systems Per HPI    Objective:   Physical Exam  Nursing note and vitals reviewed. Constitutional: She appears well-developed and well-nourished. No distress.  HENT:  Head: Normocephalic and atraumatic.  Mouth/Throat: Oropharynx is clear and moist. No oropharyngeal exudate.  Eyes: Conjunctivae and EOM are normal. Pupils are equal, round, and reactive to light. No scleral icterus.  Neck: Normal range of motion. Neck supple. Carotid bruit is not present. No thyromegaly present.  Cardiovascular: Normal rate, regular rhythm, normal heart sounds and intact distal pulses.   No murmur heard. Pulmonary/Chest: Effort normal and breath sounds normal. No respiratory distress. She has no wheezes. She has no rales.  Musculoskeletal: She exhibits no edema.  Lymphadenopathy:    She has no cervical adenopathy.  Skin: Skin is warm and dry. No rash noted.       Assessment & Plan:

## 2013-02-16 NOTE — Assessment & Plan Note (Signed)
See below. Nonsmoker, no fmhx bladder cancer, denies printing fume exposure. Microhematuria today, gross hematuria several months ago with LUTS.

## 2013-02-16 NOTE — Assessment & Plan Note (Signed)
Check TSH today to ensure correct dose.

## 2013-02-16 NOTE — Patient Instructions (Signed)
We will check thyroid today. Bring me a copy of mammogram report so I can put in your chart. Good to see you, call us with questions. Return in 1 year for next physical.

## 2013-02-16 NOTE — Addendum Note (Signed)
Addended by: Josph Macho A on: 02/16/2013 08:43 AM   Modules accepted: Orders

## 2013-02-16 NOTE — Addendum Note (Signed)
Addended by: Eustaquio Boyden on: 02/16/2013 09:10 AM   Modules accepted: Orders

## 2013-02-16 NOTE — Assessment & Plan Note (Addendum)
What sounds like UTI several months back self treated with unknown abx and sxs overall resolved. Now with persistent stress incontinence sxs.  S/p hysterectomy 2005. H/o hematuria in past but micro always with 0-3 RBC/hpf. Recheck UA today. Advised if return of any urinary sxs needs to come in for evaluation.  UA today - large blood, spgr >1.030. Micro: RBC: 5-10 /hpf. Will send UCx to eval for infection, if negative will recommend referral to urology for further eval of hematuria.

## 2013-02-18 ENCOUNTER — Other Ambulatory Visit: Payer: Self-pay | Admitting: Family Medicine

## 2013-02-18 DIAGNOSIS — R319 Hematuria, unspecified: Secondary | ICD-10-CM

## 2013-02-18 LAB — URINE CULTURE: Colony Count: 4000

## 2013-02-25 ENCOUNTER — Telehealth: Payer: Self-pay | Admitting: Family Medicine

## 2013-02-25 NOTE — Telephone Encounter (Signed)
Patient wants to know if you would do another urine culture on her before you send her to a Urologist. They required $300-$400 just to walk in the door to be seen by them. She is not having any symptoms at all right now and really doesn't have the money and thought you could do another test to be absolutely sure she needs this referral. Pls advise and call her back. She was gonna go to Urgent Care just to have her urine checked.

## 2013-02-25 NOTE — Telephone Encounter (Signed)
Does not need UCC eval.  Will call her next week with plan.

## 2013-02-25 NOTE — Telephone Encounter (Signed)
Message left advising patient. Advised to call me with questions in the meantime.

## 2013-03-11 NOTE — Telephone Encounter (Signed)
Microhematuria - see latest note.  Tried calling, left message will try later.

## 2013-03-14 NOTE — Telephone Encounter (Signed)
Called again, unable to get through.  

## 2013-03-15 NOTE — Telephone Encounter (Signed)
Spoke with patient, discussed reasons why I recommended urologist appointment including r/o bladder tumor. She states she will get insurance next week.  Advised to call us when receives insurance to schedule urologist appt.

## 2013-03-20 ENCOUNTER — Emergency Department: Payer: Self-pay | Admitting: Emergency Medicine

## 2013-03-20 LAB — URINALYSIS, COMPLETE
Bilirubin,UR: NEGATIVE
Ketone: NEGATIVE
Nitrite: NEGATIVE
RBC,UR: 2 /HPF (ref 0–5)
Specific Gravity: 1.011 (ref 1.003–1.030)
Squamous Epithelial: <1

## 2013-04-05 ENCOUNTER — Telehealth: Payer: Self-pay | Admitting: Family Medicine

## 2013-04-05 NOTE — Telephone Encounter (Signed)
Called patient and lmom about referral to Urology. She called back and left message saying that she went to the ER 2 weeks ago for swollen glands and while she was there they checked her urine and it was fine so she doesn't want to go to see a Urologist at all right now. Pls advise if you want to cancel the referral. .

## 2013-04-12 ENCOUNTER — Ambulatory Visit: Payer: Self-pay | Admitting: Obstetrics and Gynecology

## 2013-04-18 ENCOUNTER — Encounter: Payer: Self-pay | Admitting: *Deleted

## 2013-04-19 ENCOUNTER — Telehealth: Payer: Self-pay | Admitting: *Deleted

## 2013-04-19 NOTE — Telephone Encounter (Signed)
Spoke with patient - see next phone note May cancel referral for now.Marland Kitchen

## 2013-04-19 NOTE — Telephone Encounter (Signed)
Results in your IN box

## 2013-04-19 NOTE — Telephone Encounter (Signed)
Patient left message requesting mammo results from [facility] and said she had some questions about mammo. Unable to locate in chart. Requested results and left message advising I would call her when I got them to hopefully answer her questions.

## 2013-04-19 NOTE — Telephone Encounter (Signed)
Received mammogram/US.  Diagnostic mammo - R breast abnormality at 7 o clock but compression views WNL.  L breast new lobulated density anterior retroareolar area. Breast US - R breast WNL.  L breast 12 o clock lobulated area 2x0.4x1.7cm (focal hypoechoic irregularly lobulated density, ?ductal dilatation). Has appt May 13th with surgeon.  Encouraged her to keep appt w/ surg.  Also discussed hematuria - pt states had this checked at [facility] ER and told normal urine.  declines urology eval currently - aware I still recommend urology.  Will obtain UA when she comes in for blood work next month to recheck thyroid.  She will call this week to schedule lab visit. Will route to kim as fyi for UA.

## 2013-04-29 ENCOUNTER — Encounter: Payer: Self-pay | Admitting: Family Medicine

## 2013-05-09 ENCOUNTER — Ambulatory Visit: Payer: Self-pay | Admitting: Family Medicine

## 2013-05-09 ENCOUNTER — Other Ambulatory Visit (INDEPENDENT_AMBULATORY_CARE_PROVIDER_SITE_OTHER): Payer: PRIVATE HEALTH INSURANCE

## 2013-05-09 DIAGNOSIS — E039 Hypothyroidism, unspecified: Secondary | ICD-10-CM

## 2013-05-09 DIAGNOSIS — R319 Hematuria, unspecified: Secondary | ICD-10-CM

## 2013-05-09 LAB — POCT URINALYSIS DIPSTICK
Glucose, UA: NEGATIVE
Ketones, UA: NEGATIVE
Leukocytes, UA: NEGATIVE
Urobilinogen, UA: 0.2

## 2013-05-09 LAB — TSH: TSH: 0.2 u[IU]/mL — ABNORMAL LOW (ref 0.35–5.50)

## 2013-05-10 ENCOUNTER — Ambulatory Visit (INDEPENDENT_AMBULATORY_CARE_PROVIDER_SITE_OTHER): Payer: PRIVATE HEALTH INSURANCE | Admitting: General Surgery

## 2013-05-10 ENCOUNTER — Encounter: Payer: Self-pay | Admitting: General Surgery

## 2013-05-10 ENCOUNTER — Other Ambulatory Visit: Payer: Self-pay | Admitting: Family Medicine

## 2013-05-10 VITALS — BP 130/72 | HR 74 | Resp 12 | Ht 67.0 in | Wt 205.0 lb

## 2013-05-10 DIAGNOSIS — N63 Unspecified lump in unspecified breast: Secondary | ICD-10-CM

## 2013-05-10 DIAGNOSIS — R928 Other abnormal and inconclusive findings on diagnostic imaging of breast: Secondary | ICD-10-CM

## 2013-05-10 DIAGNOSIS — E039 Hypothyroidism, unspecified: Secondary | ICD-10-CM

## 2013-05-10 MED ORDER — LEVOTHYROXINE SODIUM 125 MCG PO TABS: 125.0000 ug | ORAL_TABLET | Freq: Every day | ORAL | Status: DC

## 2013-05-10 NOTE — Progress Notes (Signed)
Patient ID: Shannon Hansen, female   DO: 11/12/1966, 47 y.o.   MRM: [id number]  Chief Complaint  Patient presents with  . Other    mammo    HPI Shannon Hansen is a 47 y.o. female here today for an abnormal mammogram that was done at [facility] on 04/19/13 cat 4 and ultrasound.. Patient states she feels no lumps and does perform self breast checks. No family history of breast cancers. The patient is unaware of any trauma to the area. HPI  Past Medical History  Diagnosis Date  . Hypothyroid 2014    Past Surgical History  Procedure Laterality Date  . Abdominal hysterectomy  07/12/2004    partial hyst dysmennorhea Dr Max Sane  . Parotidectomy  [id number]    left/ Dr Renaee Munda  . [facility] er eval  2013    normal elbow, L hip, R hand and l-spine xrays    Family History  Problem Relation Age of Onset  . Hypertension Mother     labile HTN  . Diabetes Mother   . Thyroid disease Mother   . Hyperlipidemia Mother   . Heart disease Father     palpitations  . Diabetes Brother   . Cancer Maternal Grandmother     Camille Bal.uterine CA  . Cancer Maternal Aunt     liver/pancreas cancer    Social History History  Substance Use Topics  . Smoking status: Never Smoker   . Smokeless tobacco: Never Used  . Alcohol Use: No    No Known Allergies  Current Outpatient Prescriptions  Medication Sig Dispense Refill  . Acetaminophen-Aspirin Buffered (EXCEDRIN BACK & BODY PO) Take by mouth as directed.        Marland Kitchen levothyroxine (SYNTHROID, LEVOTHROID) 125 MCG tablet Take 1 tablet (125 mcg total) by mouth daily.  30 tablet  6   No current facility-administered medications for this visit.    Review of Systems Review of Systems  Constitutional: Negative.   Respiratory: Negative.   Cardiovascular: Negative.     Blood pressure 130/72, pulse 74, resp. rate 12, height 5\' 7"  (1.702 m), weight 205 lb (92.987 kg).  Physical Exam Physical Exam  Constitutional: She appears well-developed.  Neck: Neck supple.   Cardiovascular: Normal rate, regular rhythm and normal heart sounds.   Pulmonary/Chest: Effort normal and breath sounds normal. Right breast exhibits no inverted nipple, no mass, no nipple discharge, no skin change and no tenderness. Left breast exhibits no inverted nipple, no nipple discharge, no skin change and no tenderness.   No visual asymmetry of the breast.   Data Reviewed Bilateral mammograms and left breast ultrasound dated 04/19/2013 were reviewed. BI-RADS-4.  Ultrasound examination of the left breast confirmed a hypoechoic, multilobulated area measuring 1.04 x 1.41 x 1.60 in the left breast at the 1:00 position, 2 cm from the nipple.  The patient was amenable to vacuum assisted biopsy. 10 cc of 0.5% Xylocaine with 0.25% Marcaine with 1 200,000 of epinephrine was utilized well tolerated. Lower prep was applied to the skin. A 10-gauge Encor device was then passed under ultrasound guidance. 12 core samples were obtained. Scant bleeding was noted. Skin defect was closed with benzoin and Steri-Strips followed by Telfa and Tegaderm dressing. The procedure was well tolerated. Written instructions were provided.  Assessment    Abnormal left breast mammogram.    Plan    The patient will be contacted when the pathology is available.       Earline Mayotte 05/11/2013, 7:32 PM

## 2013-05-10 NOTE — Patient Instructions (Signed)

## 2013-05-11 ENCOUNTER — Telehealth: Payer: Self-pay | Admitting: General Surgery

## 2013-05-11 ENCOUNTER — Encounter: Payer: Self-pay | Admitting: General Surgery

## 2013-05-11 DIAGNOSIS — R928 Other abnormal and inconclusive findings on diagnostic imaging of breast: Secondary | ICD-10-CM | POA: Insufficient documentation

## 2013-05-11 LAB — PATHOLOGY

## 2013-05-11 NOTE — Telephone Encounter (Signed)
A message was left on the patient cell up the biopsy results were fine. She was reminded to followup next week with a nurse as scheduled.  This office will arrange for followup left breast mammogram in 6 months with an office visit to follow.

## 2013-05-12 ENCOUNTER — Encounter: Payer: Self-pay | Admitting: Family Medicine

## 2013-05-12 ENCOUNTER — Encounter: Payer: Self-pay | Admitting: General Surgery

## 2013-05-12 ENCOUNTER — Other Ambulatory Visit: Payer: Self-pay | Admitting: *Deleted

## 2013-05-12 DIAGNOSIS — N63 Unspecified lump in unspecified breast: Secondary | ICD-10-CM

## 2013-05-12 DIAGNOSIS — R928 Other abnormal and inconclusive findings on diagnostic imaging of breast: Secondary | ICD-10-CM

## 2013-05-12 NOTE — Progress Notes (Signed)
The patient has been asked to return to the office in six months for a unilateral left breast diagnostic mammogram.  

## 2013-05-17 ENCOUNTER — Ambulatory Visit (INDEPENDENT_AMBULATORY_CARE_PROVIDER_SITE_OTHER): Payer: PRIVATE HEALTH INSURANCE | Admitting: *Deleted

## 2013-05-17 DIAGNOSIS — N63 Unspecified lump in unspecified breast: Secondary | ICD-10-CM

## 2013-05-17 NOTE — Patient Instructions (Addendum)
Discussed follow-up appointments, patient agrees Nov for left mammogram and office visit

## 2013-05-17 NOTE — Progress Notes (Signed)
Patient here today for follow up post left breast biopsy.  No dressing or strips.  Patient requested band aid be applied, done.  Minimal bruising noted.  The patient is aware that a heating pad may be used for comfort as needed.  Aware of pathology.

## 2013-05-19 ENCOUNTER — Ambulatory Visit: Payer: PRIVATE HEALTH INSURANCE

## 2013-05-28 ENCOUNTER — Encounter: Payer: Self-pay | Admitting: Family Medicine

## 2013-08-24 ENCOUNTER — Encounter: Payer: Self-pay | Admitting: Family Medicine

## 2013-08-24 ENCOUNTER — Ambulatory Visit (INDEPENDENT_AMBULATORY_CARE_PROVIDER_SITE_OTHER): Payer: BC Managed Care – PPO | Admitting: Family Medicine

## 2013-08-24 VITALS — BP 116/82 | HR 84 | Temp 98.3°F | Wt 205.2 lb

## 2013-08-24 DIAGNOSIS — R319 Hematuria, unspecified: Secondary | ICD-10-CM

## 2013-08-24 DIAGNOSIS — E039 Hypothyroidism, unspecified: Secondary | ICD-10-CM

## 2013-08-24 DIAGNOSIS — M7989 Other specified soft tissue disorders: Secondary | ICD-10-CM

## 2013-08-24 LAB — POCT URINALYSIS DIPSTICK
Glucose, UA: NEGATIVE
Nitrite, UA: NEGATIVE
Protein, UA: NEGATIVE
Urobilinogen, UA: 0.2

## 2013-08-24 NOTE — Addendum Note (Signed)
Addended by: Josph Macho A on: 08/24/2013 02:23 PM   Modules accepted: Orders

## 2013-08-24 NOTE — Assessment & Plan Note (Signed)
?  early trigger finger.  Recommended ibuprofen prn. If worsening to update me for further evaluation. Not consistent with dactylitis or arthritis. Pt agrees with plan.

## 2013-08-24 NOTE — Addendum Note (Signed)
Addended by: Eustaquio Boyden on: 08/24/2013 02:36 PM   Modules accepted: Orders

## 2013-08-24 NOTE — Assessment & Plan Note (Signed)
Recheck TSH today.  

## 2013-08-24 NOTE — Patient Instructions (Signed)
Blood work today to check thyroid. Urine check today for blood. Work on increasing activity and exercise. Use ibuprofen for joint pains.  Let me know if not improving. Good to see you - we will call you with results of thyroid test.

## 2013-08-24 NOTE — Assessment & Plan Note (Signed)
Recheck UA - if positive for blood will refer to urology. Nonsmoker, no fmhx bladder cancer. Denies printing fume exposure.

## 2013-08-24 NOTE — Progress Notes (Signed)
  Subjective:    Patient ID: Shannon Hansen, female    DOB: 1966-04-22, 47 y.o.   MRN: [id number]  HPI CC: weight, thyroid  Weight stable over last several months, actually decreased since beginning of year. Wt Readings from Last 3 Encounters:  08/24/13 205 lb 4 oz (93.101 kg)  05/10/13 205 lb (92.987 kg)  02/16/13 213 lb 4 oz (96.73 kg)   Stays active at work - lots of walking.  H/o hypothyroidism on levothyroxine.  Due for TSH recheck today. Lab Results  Component Value Date   TSH 0.20* 05/09/2013    R index finger - swollen and sore.  Present for last month.  Not painful.  Entire finger with limited flexion.  No locking.  No erythema or warmth.  No oral lesions or abd pain or nausea.  No fevers/chills.  This morning noticed rash under L axilla.  Papular.  Itchy, not tender.  Occasional L ankle pain.  Denies inciting trauma.  Recent L breast mass s/p benign biopsy.  F/u mammo due 10/2013.  Saw Dr Birdie Sons. H/o hematuria - declined further evaluation by urology despite my recommendation to seek eval for persistent microhematuria.  States has had several evaluations with no persistent blood.  LMP - s/p partial hysterectomy.  Feels going through menopause.  Sees [facility].  Past Medical History  Diagnosis Date  . Hypothyroid 2014    Review of Systems Per HPI    Objective:   Physical Exam  Nursing note and vitals reviewed. Constitutional: She appears well-developed and well-nourished. No distress.  Neck: No thyromegaly present.  Cardiovascular: Normal rate, regular rhythm, normal heart sounds and intact distal pulses.   No murmur heard. Pulmonary/Chest: Effort normal and breath sounds normal. No respiratory distress. She has no wheezes. She has no rales.  Musculoskeletal: She exhibits no edema.  R index finger without evident swelling.  Tender at palmar MCP of index (possible nodule palpated), tender at IP of index finger as well. L ankle without ligament laxity,  tenderness.  2+ DP.  No edema.  Skin: Skin is warm and dry. Rash noted.  Faint papular rash on left anterior axilla       Assessment & Plan:

## 2013-08-25 LAB — TSH: TSH: 1.56 u[IU]/mL (ref 0.35–5.50)

## 2013-08-26 ENCOUNTER — Encounter: Payer: Self-pay | Admitting: *Deleted

## 2013-11-17 ENCOUNTER — Ambulatory Visit: Payer: Self-pay | Admitting: General Surgery

## 2013-11-18 ENCOUNTER — Encounter: Payer: Self-pay | Admitting: General Surgery

## 2013-11-30 ENCOUNTER — Ambulatory Visit: Payer: PRIVATE HEALTH INSURANCE | Admitting: General Surgery

## 2013-12-07 ENCOUNTER — Other Ambulatory Visit: Payer: Self-pay | Admitting: Family Medicine

## 2014-01-04 ENCOUNTER — Encounter: Payer: Self-pay | Admitting: *Deleted

## 2014-07-12 ENCOUNTER — Other Ambulatory Visit: Payer: Self-pay | Admitting: Family Medicine

## 2014-09-11 ENCOUNTER — Other Ambulatory Visit: Payer: Self-pay | Admitting: Family Medicine

## 2014-10-30 ENCOUNTER — Encounter: Payer: Self-pay | Admitting: Family Medicine

## 2015-01-02 ENCOUNTER — Emergency Department: Payer: Self-pay | Admitting: Emergency Medicine

## 2015-10-22 ENCOUNTER — Other Ambulatory Visit: Payer: Self-pay | Admitting: Family Medicine

## 2015-10-22 DIAGNOSIS — Z1231 Encounter for screening mammogram for malignant neoplasm of breast: Secondary | ICD-10-CM

## 2015-10-29 ENCOUNTER — Ambulatory Visit: Payer: Self-pay

## 2015-11-15 ENCOUNTER — Ambulatory Visit: Payer: Self-pay

## 2015-11-28 ENCOUNTER — Ambulatory Visit
Admission: RE | Admit: 2015-11-28 | Discharge: 2015-11-28 | Disposition: A | Discharge status: Home / Self Care | Payer: 59 | Source: Ambulatory Visit | Attending: Family Medicine | Admitting: Family Medicine

## 2015-11-28 DIAGNOSIS — Z1231 Encounter for screening mammogram for malignant neoplasm of breast: Secondary | ICD-10-CM | POA: Insufficient documentation

## 2016-12-16 ENCOUNTER — Other Ambulatory Visit: Payer: Self-pay | Admitting: Family Medicine

## 2016-12-16 DIAGNOSIS — Z1231 Encounter for screening mammogram for malignant neoplasm of breast: Secondary | ICD-10-CM

## 2017-01-16 ENCOUNTER — Ambulatory Visit: Payer: 59 | Attending: Family Medicine

## 2018-08-04 ENCOUNTER — Encounter: Payer: Self-pay | Admitting: Emergency Medicine

## 2018-08-04 ENCOUNTER — Emergency Department
Admission: EM | Admit: 2018-08-04 | Discharge: 2018-08-04 | Disposition: A | Discharge status: Home / Self Care | Payer: 59 | Attending: Emergency Medicine | Admitting: Emergency Medicine

## 2018-08-04 ENCOUNTER — Other Ambulatory Visit: Payer: Self-pay

## 2018-08-04 DIAGNOSIS — N3001 Acute cystitis with hematuria: Secondary | ICD-10-CM | POA: Insufficient documentation

## 2018-08-04 DIAGNOSIS — Z7982 Long term (current) use of aspirin: Secondary | ICD-10-CM | POA: Insufficient documentation

## 2018-08-04 DIAGNOSIS — E039 Hypothyroidism, unspecified: Secondary | ICD-10-CM | POA: Insufficient documentation

## 2018-08-04 LAB — URINALYSIS, COMPLETE (UACMP) WITH MICROSCOPIC
Bilirubin Urine: NEGATIVE
Glucose, UA: NEGATIVE mg/dL
Ketones, ur: NEGATIVE mg/dL
NITRITE: NEGATIVE
Protein, ur: 30 mg/dL — AB
RBC / HPF: >50 RBC/hpf — ABNORMAL HIGH (ref 0–5)
SPECIFIC GRAVITY, URINE: 1.008 (ref 1.005–1.030)
WBC, UA: >50 WBC/hpf — ABNORMAL HIGH (ref 0–5)
pH: 6 (ref 5.0–8.0)

## 2018-08-04 MED ORDER — CEPHALEXIN 500 MG PO CAPS: 500.0000 mg | ORAL_CAPSULE | Freq: Three times a day (TID) | ORAL | 0 refills | Status: AC

## 2018-08-04 MED ORDER — CEFTRIAXONE SODIUM 1 G IJ SOLR
1.0000 g | Freq: Once | INTRAMUSCULAR | Status: AC
  Administered 2018-08-04: 1 g via INTRAMUSCULAR
  Filled 2018-08-04: qty 10

## 2018-08-04 NOTE — ED Provider Notes (Signed)
[facility] Emergency Department Provider Note  ____________________________________________  Time seen: Approximately 8:34 PM  I have reviewed the triage vital signs and the nursing notes.   HISTORY  Chief Complaint No chief complaint on file.    HPI Shannon Hansen is a 52 y.o. female presents to the emergency department with dysuria, hematuria and increased urinary frequency for 1 day.  Patient also has right-sided low back pain.  Patient reported fever and chills yesterday but no fever today.  Patient also had abdominal discomfort yesterday but none today.  Patient has been taking garlic as she has used garlic in the past as a natural diuretic she experienced cystitis.  It been at least 6 months since patient has had urinary tract infection.  Patient denies sexual promiscuity.  No prior history of nephrolithiasis.    Past Medical History:  Diagnosis Date  . Hypothyroid 2014    Patient Active Problem List   Diagnosis Date Noted  . Swelling of finger, right 08/24/2013  . Abnormal mammogram 05/11/2013  . Urinary incontinence 02/16/2013  . Left ankle pain 07/07/2011  . Microhematuria 06/26/2011  . HYPOTHYROIDISM 04/05/2009  . HYPERLIPIDEMIA 04/05/2009    Past Surgical History:  Procedure Laterality Date  . ABDOMINAL HYSTERECTOMY  07/12/2004   partial hyst dysmennorhea Dr Vernie Ammons.  No BSO.  Marland Kitchen [facility] ER eval  2013   normal elbow, L hip, R hand and l-spine xrays  . BREAST BIOPSY Left 2014   benign: fibroadenomatous changes stereotactic  . PAROTIDECTOMY  [id number]   left/ Dr Faylene Million    Prior to Admission medications   Medication Sig Start Date End Date Taking? Authorizing Provider  Acetaminophen-Aspirin Buffered (EXCEDRIN BACK & BODY PO) Take by mouth as directed.      [provider]  aspirin EC 81 MG tablet Take 81 mg by mouth every other day.    [provider]  cephALEXin (KEFLEX) 500 MG capsule Take 1 capsule (500 mg total) by  mouth 3 (three) times daily for 10 days. 08/04/18 08/14/18  Lannie Fields, PA-C  ibuprofen (ADVIL,MOTRIN) 200 MG tablet Take 200 mg by mouth every 6 (six) hours as needed for pain.    [provider]  levothyroxine (SYNTHROID, LEVOTHROID) 125 MCG tablet TAKE ONE TABLET EVERY DAY **MUST HAVE FOLLOW UP FOR FURTHER REFILLS** 09/11/14   Ria Bush, MD    Allergies Patient has no known allergies.  Family History  Problem Relation Age of Onset  . Hypertension Mother        labile HTN  . Diabetes Mother   . Thyroid disease Mother   . Hyperlipidemia Mother   . Heart disease Father        palpitations  . Diabetes Brother   . Cancer Maternal Grandmother        Rush Farmer.uterine CA  . Cancer Maternal Aunt        liver/pancreas cancer    Social History Social History   Tobacco Use  . Smoking status: Never Smoker  . Smokeless tobacco: Never Used  Substance Use Topics  . Alcohol use: No  . Drug use: No     Review of Systems  Constitutional: No fever/chills Eyes: No visual changes. No discharge ENT: No upper respiratory complaints. Cardiovascular: no chest pain. Respiratory: no cough. No SOB. Gastrointestinal: No abdominal pain.  No nausea, no vomiting.  No diarrhea.  No constipation. Genitourinary: Patient has dysuria, increased urinary frequency and hematuria. Musculoskeletal: Negative for musculoskeletal pain. Skin: Negative for rash, abrasions,  lacerations, ecchymosis. Neurological: Negative for headaches, focal weakness or numbness.   ____________________________________________   PHYSICAL EXAM:  VITAL SIGNS: ED Triage Vitals  Enc Vitals Group     BP 08/04/18 2000 (!) 153/94     Pulse Rate 08/04/18 2000 65     Resp 08/04/18 2000 18     Temp 08/04/18 2000 98.3 F (36.8 C)     Temp Source 08/04/18 2000 Oral     SpO2 08/04/18 2000 100 %     Weight 08/04/18 1953 220 lb (99.8 kg)     Height 08/04/18 1953 5\' 7"  (1.702 m)     Head Circumference --       Peak Flow --      Pain Score 08/04/18 1953 0     Pain Loc --      Pain Edu? --      Excl. in [place]? --      Constitutional: Alert and oriented. Well appearing and in no acute distress. Eyes: Conjunctivae are normal. PERRL. EOMI. Head: Atraumatic. Cardiovascular: Normal rate, regular rhythm. Normal S1 and S2.  Good peripheral circulation. Respiratory: Normal respiratory effort without tachypnea or retractions. Lungs CTAB. Good air entry to the bases with no decreased or absent breath sounds. Gastrointestinal: Bowel sounds 4 quadrants.  Patient has suprapubic tenderness to palpation. No guarding or rigidity. No palpable masses. No distention. No CVA tenderness. Musculoskeletal: Full range of motion to all extremities. No gross deformities appreciated. Neurologic:  Normal speech and language. No gross focal neurologic deficits are appreciated.  Skin:  Skin is warm, dry and intact. No rash noted.   ____________________________________________   LABS (all labs ordered are listed, but only abnormal results are displayed)  Labs Reviewed  URINALYSIS, COMPLETE (UACMP) WITH MICROSCOPIC - Abnormal; Notable for the following components:      Result Value   Color, Urine YELLOW (*)    APPearance HAZY (*)    Hgb urine dipstick LARGE (*)    Protein, ur 30 (*)    Leukocytes, UA LARGE (*)    RBC / HPF >50 (*)    WBC, UA >50 (*)    Bacteria, UA RARE (*)    All other components within normal limits   ____________________________________________  EKG   ____________________________________________  RADIOLOGY  No results found.  ____________________________________________    PROCEDURES  Procedure(s) performed:    Procedures    Medications  cefTRIAXone (ROCEPHIN) injection 1 g (1 g Intramuscular Given 08/04/18 2119)     ____________________________________________   INITIAL IMPRESSION / ASSESSMENT AND PLAN / ED COURSE  Pertinent labs & imaging results that were available  during my care of the patient were reviewed by me and considered in my medical decision making (see chart for details).  Review of the [place] CSRS was performed in accordance of the [place] prior to dispensing any controlled drugs.      Assessment and Plan: Cystitis:  Patient presents to the emergency department with dysuria, hematuria and increased urinary frequency for 1 day with suprapubic tenderness on physical exam..  Patient was treated empirically with Rocephin and Keflex. Patient's urinalysis was concerning for cystitis.  She was advised to follow-up with primary care as needed.  All patient questions were answered.    ____________________________________________  FINAL CLINICAL IMPRESSION(S) / ED DIAGNOSES  Final diagnoses:  Acute cystitis with hematuria      NEW MEDICATIONS STARTED DURING THIS VISIT:  ED Discharge Orders        Ordered    cephALEXin (KEFLEX)  500 MG capsule  3 times daily     08/04/18 2120          This chart was dictated using voice recognition software/Dragon. Despite best efforts to proofread, errors can occur which can change the meaning. Any change was purely unintentional.    Lannie Fields, PA-C 08/04/18 2125    Harvest Dark, MD 08/04/18 2251

## 2018-08-04 NOTE — ED Triage Notes (Signed)
Patient ambulatory to triage with steady gait, without difficulty or distress noted; pt reports onset dysuria this evening; denies abd or back pain

## 2020-12-06 ENCOUNTER — Other Ambulatory Visit: Payer: Self-pay

## 2020-12-06 ENCOUNTER — Emergency Department: Payer: 59

## 2020-12-06 ENCOUNTER — Emergency Department
Admission: EM | Admit: 2020-12-06 | Discharge: 2020-12-06 | Disposition: A | Discharge status: Home / Self Care | Payer: 59 | Attending: Emergency Medicine | Admitting: Emergency Medicine

## 2020-12-06 ENCOUNTER — Encounter: Payer: Self-pay | Admitting: Emergency Medicine

## 2020-12-06 DIAGNOSIS — S299XXA Unspecified injury of thorax, initial encounter: Secondary | ICD-10-CM | POA: Diagnosis present

## 2020-12-06 DIAGNOSIS — Y92009 Unspecified place in unspecified non-institutional (private) residence as the place of occurrence of the external cause: Secondary | ICD-10-CM

## 2020-12-06 DIAGNOSIS — W19XXXA Unspecified fall, initial encounter: Secondary | ICD-10-CM

## 2020-12-06 DIAGNOSIS — S2242XA Multiple fractures of ribs, left side, initial encounter for closed fracture: Secondary | ICD-10-CM | POA: Insufficient documentation

## 2020-12-06 DIAGNOSIS — Z7982 Long term (current) use of aspirin: Secondary | ICD-10-CM | POA: Diagnosis not present

## 2020-12-06 DIAGNOSIS — Y92019 Unspecified place in single-family (private) house as the place of occurrence of the external cause: Secondary | ICD-10-CM | POA: Diagnosis not present

## 2020-12-06 DIAGNOSIS — E039 Hypothyroidism, unspecified: Secondary | ICD-10-CM | POA: Diagnosis not present

## 2020-12-06 DIAGNOSIS — Z79899 Other long term (current) drug therapy: Secondary | ICD-10-CM | POA: Insufficient documentation

## 2020-12-06 DIAGNOSIS — W010XXA Fall on same level from slipping, tripping and stumbling without subsequent striking against object, initial encounter: Secondary | ICD-10-CM | POA: Diagnosis not present

## 2020-12-06 MED ORDER — LIDOCAINE 5 % EX PTCH
1.0000 | MEDICATED_PATCH | CUTANEOUS | Status: DC
  Administered 2020-12-06: 1 via TRANSDERMAL
  Filled 2020-12-06: qty 1

## 2020-12-06 MED ORDER — HYDROCODONE-ACETAMINOPHEN 5-325 MG PO TABS: 1.0000 | ORAL_TABLET | Freq: Three times a day (TID) | ORAL | 0 refills | Status: AC | PRN

## 2020-12-06 NOTE — ED Notes (Signed)
Patient c/o rash across chest. PA-C aware.

## 2020-12-06 NOTE — Discharge Instructions (Signed)
Use the prescription meds as directed. Take your daily Meloxicam for inflammation.  Apply ice and/or moist heat to reduce pain. Follow-up with your provider for continued symptoms.

## 2020-12-06 NOTE — ED Triage Notes (Signed)
Pt via pov from home after a fall 2 days ago She reports that she fell off a low porch and hit the ground, resulting in left ribcage pain. Pt denies loc or head injury. Pt alert & oriented, nad noted.

## 2020-12-06 NOTE — ED Provider Notes (Signed)
[facility] Emergency Department Provider Note ____________________________________________  Time seen: 1033  I have reviewed the triage vital signs and the nursing notes.  HISTORY  Chief Complaint  Rib Injury   HPI Shannon Hansen is a 54 y.o. female presents herself to the ED for evaluation of left-sided chest wall pain.   Patient describes a mechanical fall 2 days ago when she tripped and fell off of her lower porch hitting the ground.  Since that time she has had left chest wall/rib cage pain.  She denies any head injury or loss of consciousness patient also denies any other serious injury other than some abrasions to her palms and her right knee.  She denies any interim cough, shortness of breath, or hemoptysis.  Past Medical History:  Diagnosis Date  . Hypothyroid 2014    Patient Active Problem List   Diagnosis Date Noted  . Swelling of finger, right 08/24/2013  . Abnormal mammogram 05/11/2013  . Urinary incontinence 02/16/2013  . Left ankle pain 07/07/2011  . Microhematuria 06/26/2011  . HYPOTHYROIDISM 04/05/2009  . HYPERLIPIDEMIA 04/05/2009    Past Surgical History:  Procedure Laterality Date  . ABDOMINAL HYSTERECTOMY  07/12/2004   partial hyst dysmennorhea Dr Vernie Ammons.  No BSO.  Marland Kitchen [facility] ER eval  2013   normal elbow, L hip, R hand and l-spine xrays  . BREAST BIOPSY Left 2014   benign: fibroadenomatous changes stereotactic  . PAROTIDECTOMY  [id number]   left/ Dr Faylene Million    Prior to Admission medications   Medication Sig Start Date End Date Taking? Authorizing Provider  Acetaminophen-Aspirin Buffered (EXCEDRIN BACK & BODY PO) Take by mouth as directed.      [provider]  aspirin EC 81 MG tablet Take 81 mg by mouth every other day.    [provider]  HYDROcodone-acetaminophen (NORCO) 5-325 MG tablet Take 1 tablet by mouth 3 (three) times daily as needed for up to 5 days. 12/06/20 12/11/20  Miria Cappelli, Dannielle Karvonen, PA-C   ibuprofen (ADVIL,MOTRIN) 200 MG tablet Take 200 mg by mouth every 6 (six) hours as needed for pain.    [provider]  levothyroxine (SYNTHROID, LEVOTHROID) 125 MCG tablet TAKE ONE TABLET EVERY DAY **MUST HAVE FOLLOW UP FOR FURTHER REFILLS** 09/11/14   Ria Bush, MD    Allergies Patient has no known allergies.  Family History  Problem Relation Age of Onset  . Hypertension Mother        labile HTN  . Diabetes Mother   . Thyroid disease Mother   . Hyperlipidemia Mother   . Heart disease Father        palpitations  . Diabetes Brother   . Cancer Maternal Grandmother        Rush Farmer.uterine CA  . Cancer Maternal Aunt        liver/pancreas cancer    Social History Social History   Tobacco Use  . Smoking status: Never Smoker  . Smokeless tobacco: Current User    Types: Chew  Vaping Use  . Vaping Use: Never used  Substance Use Topics  . Alcohol use: No  . Drug use: No    Review of Systems  Constitutional: Negative for fever. Eyes: Negative for visual changes. ENT: Negative for sore throat. Cardiovascular: Negative for chest pain. Respiratory: Negative for shortness of breath.  Left chest wall pain as above. Gastrointestinal: Negative for abdominal pain, vomiting and diarrhea. Genitourinary: Negative for dysuria. Musculoskeletal: Negative for back pain. Skin: Negative for rash. Neurological: Negative  for headaches, focal weakness or numbness. ____________________________________________  PHYSICAL EXAM:  VITAL SIGNS: ED Triage Vitals  Enc Vitals Group     BP 12/06/20 0920 (!) 149/79     Pulse Rate 12/06/20 0920 73     Resp 12/06/20 0920 18     Temp 12/06/20 0920 98.8 F (37.1 C)     Temp Source 12/06/20 0920 Oral     SpO2 12/06/20 0920 100 %     Weight 12/06/20 0920 185 lb (83.9 kg)     Height 12/06/20 0920 5\' 7"  (1.702 m)     Head Circumference --      Peak Flow --      Pain Score 12/06/20 0926 8     Pain Loc --      Pain Edu? --       Excl. in [place]? --     Constitutional: Alert and oriented. Well appearing and in no distress. Head: Normocephalic and atraumatic. Eyes: Conjunctivae are normal. Normal extraocular movements Cardiovascular: Normal rate, regular rhythm. Normal distal pulses. Respiratory: Normal respiratory effort. No wheezes/rales/rhonchi.  No chest wall deformity, ecchymosis, abrasion, or flail chest appreciated on the left side.  Normal symmetric chest rise is noted. Gastrointestinal: Soft and nontender. No distention. Musculoskeletal: Nontender with normal range of motion in all extremities.  Neurologic:  Normal gait without ataxia. Normal speech and language. No gross focal neurologic deficits are appreciated. Skin:  Skin is warm, dry and intact. No rash noted. ___________________________________________   RADIOLOGY  Left Rib w/ CXR  IMPRESSION: 1. Mildly angulated left lateral seventh and eighth rib fractures. 2. No pneumothorax. ____________________________________________  PROCEDURES  Lidocaine 5% patch  Procedures ____________________________________________  INITIAL IMPRESSION / ASSESSMENT AND PLAN / ED COURSE  Patient with ED evaluation of injury sustained following mechanical fall at home 2 days ago.  She presents with acute left-sided rib cage pain.  Her chest x-ray does reveal lateral seventh and eighth rib fractures are mildly angulated.  No pneumothorax is noted.  Patient will be treated for her acute pain and is given instructions on splinting for her chest wall injury.  Prescription for hydrocodone was also provided.  Patient will follow up with her primary provider for ongoing symptoms or return to the ED if needed.  She is released to work activities as tolerated.  KERON KOFFMAN was evaluated in Emergency Department on 12/06/2020 for the symptoms described in the history of present illness. She was evaluated in the context of the global COVID-19 pandemic, which necessitated consideration  that the patient might be at risk for infection with the SARS-CoV-2 virus that causes COVID-19. Institutional protocols and algorithms that pertain to the evaluation of patients at risk for COVID-19 are in a state of rapid change based on information released by regulatory bodies including the CDC and federal and state organizations. These policies and algorithms were followed during the patient's care in the ED.  I reviewed the patient's prescription history over the last 12 months in the multi-state controlled substances database(s) that includes [place], [place], [place], [place], [place], [place], [place], [place], [place], [place], [place], [place], [place], and [place].  Results were notable for no RX history. ____________________________________________  FINAL CLINICAL IMPRESSION(S) / ED DIAGNOSES  Final diagnoses:  Closed fracture of multiple ribs of left side, initial encounter  Fall at home, initial encounter      Melvenia Needles, PA-C 12/06/20 1918    Naaman Plummer, MD 12/07/20 [phone]

## 2021-01-24 ENCOUNTER — Other Ambulatory Visit: Payer: Self-pay

## 2021-01-24 ENCOUNTER — Emergency Department
Admission: EM | Admit: 2021-01-24 | Discharge: 2021-01-24 | Disposition: A | Discharge status: Home / Self Care | Payer: 59 | Attending: Physician Assistant | Admitting: Physician Assistant

## 2021-01-24 ENCOUNTER — Encounter: Payer: Self-pay | Admitting: Emergency Medicine

## 2021-01-24 DIAGNOSIS — E039 Hypothyroidism, unspecified: Secondary | ICD-10-CM | POA: Insufficient documentation

## 2021-01-24 DIAGNOSIS — L240 Irritant contact dermatitis due to detergents: Secondary | ICD-10-CM

## 2021-01-24 DIAGNOSIS — Z79899 Other long term (current) drug therapy: Secondary | ICD-10-CM | POA: Diagnosis not present

## 2021-01-24 DIAGNOSIS — R21 Rash and other nonspecific skin eruption: Secondary | ICD-10-CM | POA: Diagnosis present

## 2021-01-24 DIAGNOSIS — L299 Pruritus, unspecified: Secondary | ICD-10-CM | POA: Insufficient documentation

## 2021-01-24 MED ORDER — HYDROCORTISONE VALERATE 0.2 % EX OINT: TOPICAL_OINTMENT | CUTANEOUS | 1 refills | Status: AC

## 2021-01-24 MED ORDER — HYDROXYZINE PAMOATE 100 MG PO CAPS: 100.0000 mg | ORAL_CAPSULE | Freq: Three times a day (TID) | ORAL | 0 refills | Status: DC | PRN

## 2021-01-24 NOTE — ED Notes (Signed)
No e-signature obtained due to no e-signature pad being available.  Pt verbalized understanding of all discharge instructions.

## 2021-01-24 NOTE — Discharge Instructions (Addendum)
Follow discharge care instruction take medication as directed.  If rash reoccurs follow-up with dermatology clinic listed in your discharge care instructions.

## 2021-01-24 NOTE — ED Triage Notes (Signed)
Itching all over x 2 weeks.    AAOx3.  Skin warm and dry. NAD

## 2021-01-24 NOTE — ED Provider Notes (Signed)
[facility] Emergency Department Provider Note   ____________________________________________   None    (approximate)  I have reviewed the triage vital signs and the nursing notes.   HISTORY  Chief Complaint No chief complaint on file.    HPI Shannon Hansen is a 55 y.o. female patient presents with diffuse rash and itching for 2 weeks.  Patient states she called her PCP last week and they prescribed her Medrol Dosepak which only brought transient relief.  Patient states she is concerned because have not given the results of her TSH and other lab tests which were done last week.  Patient requests medication for rash and itching.  Patient states there has been change in laundry and personal hygiene products in the past month.  Rash spares the neck and facial area.         Past Medical History:  Diagnosis Date  . Hypothyroid 2014    Patient Active Problem List   Diagnosis Date Noted  . Swelling of finger, right 08/24/2013  . Abnormal mammogram 05/11/2013  . Urinary incontinence 02/16/2013  . Left ankle pain 07/07/2011  . Microhematuria 06/26/2011  . HYPOTHYROIDISM 04/05/2009  . HYPERLIPIDEMIA 04/05/2009    Past Surgical History:  Procedure Laterality Date  . ABDOMINAL HYSTERECTOMY  07/12/2004   partial hyst dysmennorhea Dr Vernie Ammons.  No BSO.  Marland Kitchen [facility] ER eval  2013   normal elbow, L hip, R hand and l-spine xrays  . BREAST BIOPSY Left 2014   benign: fibroadenomatous changes stereotactic  . PAROTIDECTOMY  [id number]   left/ Dr Faylene Million    Prior to Admission medications   Medication Sig Start Date End Date Taking? Authorizing Provider  hydrocortisone valerate ointment (WESTCORT) 0.2 % Apply to affected area daily 01/24/21 01/24/22 Yes Sable Feil, PA-C  hydrOXYzine (VISTARIL) 100 MG capsule Take 1 capsule (100 mg total) by mouth 3 (three) times daily as needed for itching. 01/24/21  Yes Sable Feil, PA-C  Acetaminophen-Aspirin Buffered  (EXCEDRIN BACK & BODY PO) Take by mouth as directed.      [provider]  aspirin EC 81 MG tablet Take 81 mg by mouth every other day.    [provider]  ibuprofen (ADVIL,MOTRIN) 200 MG tablet Take 200 mg by mouth every 6 (six) hours as needed for pain.    [provider]  levothyroxine (SYNTHROID, LEVOTHROID) 125 MCG tablet TAKE ONE TABLET EVERY DAY **MUST HAVE FOLLOW UP FOR FURTHER REFILLS** 09/11/14   Ria Bush, MD    Allergies Patient has no known allergies.  Family History  Problem Relation Age of Onset  . Hypertension Mother        labile HTN  . Diabetes Mother   . Thyroid disease Mother   . Hyperlipidemia Mother   . Heart disease Father        palpitations  . Diabetes Brother   . Cancer Maternal Grandmother        Rush Farmer.uterine CA  . Cancer Maternal Aunt        liver/pancreas cancer    Social History Social History   Tobacco Use  . Smoking status: Never Smoker  . Smokeless tobacco: Current User    Types: Chew  Vaping Use  . Vaping Use: Never used  Substance Use Topics  . Alcohol use: No  . Drug use: No    Review of Systems Constitutional: No fever/chills Eyes: No visual changes. ENT: No sore throat. Cardiovascular: Denies chest pain. Respiratory: Denies shortness of breath.  Gastrointestinal: No abdominal pain.  No nausea, no vomiting.  No diarrhea.  No constipation. Genitourinary: Negative for dysuria. Musculoskeletal: Negative for back pain. Skin: Positive for rash. Neurological: Negative for headaches, focal weakness or numbness. Psychiatric:  Endocrine:  Hyperlipidemia and hypothyroidism.   ____________________________________________   PHYSICAL EXAM:  VITAL SIGNS: ED Triage Vitals  Enc Vitals Group     BP 01/24/21 0714 138/71     Pulse Rate 01/24/21 0714 78     Resp 01/24/21 0714 15     Temp 01/24/21 0714 98 F (36.7 C)     Temp Source 01/24/21 0714 Oral     SpO2 01/24/21 0714 99 %     Weight  01/24/21 0712 184 lb 15.5 oz (83.9 kg)     Height 01/24/21 0712 5\' 7"  (1.702 m)     Head Circumference --      Peak Flow --      Pain Score 01/24/21 0712 0     Pain Loc --      Pain Edu? --      Excl. in [place]? --    Constitutional: Alert and oriented. Well appearing and in no acute distress. Cardiovascular: Normal rate, regular rhythm. Grossly normal heart sounds.  Good peripheral circulation. Respiratory: Normal respiratory effort.  No retractions. Lungs CTAB. Genitourinary: Deferred Musculoskeletal: No lower extremity tenderness nor edema.  No joint effusions. Neurologic:  Normal speech and language. No gross focal neurologic deficits are appreciated. No gait instability. Skin:  Skin is warm, dry and intact. No rash noted. Psychiatric: Mood and affect are normal. Speech and behavior are normal.  ____________________________________________   LABS (all labs ordered are listed, but only abnormal results are displayed)  Labs Reviewed - No data to display ____________________________________________  EKG   ____________________________________________  RADIOLOGY I, Sable Feil, personally viewed and evaluated these images (plain radiographs) as part of my medical decision making, as well as reviewing the written report by the radiologist.  ED MD interpretation:    Official radiology report(s): No results found.  ____________________________________________   PROCEDURES  Procedure(s) performed (including Critical Care):  Procedures   ____________________________________________   INITIAL IMPRESSION / ASSESSMENT AND PLAN / ED COURSE  As part of my medical decision making, I reviewed the following data within the [place]         Patient presents with itching and rash for 2 weeks.  Physical exam is negative for visible rash.  There are mild signs excoriation on the bilateral upper extremity.  Patient complaint physical exam is consistent with  contact dermatitis and pruritus.  Trial Atarax for Westcort cream.  Advised patient to contact family doctor for lab results.  Advised patient if no improvement or recurrence of rash to follow-up with dermatology clinic listed on her discharge care instructions.      ____________________________________________   FINAL CLINICAL IMPRESSION(S) / ED DIAGNOSES  Final diagnoses:  Contact dermatitis due to detergent, unspecified contact dermatitis type     ED Discharge Orders         Ordered    hydrOXYzine (VISTARIL) 100 MG capsule  3 times daily PRN        01/24/21 0747    hydrocortisone valerate ointment (WESTCORT) 0.2 %        01/24/21 0747          *Please note:  YOSHIE THIBAUT was evaluated in Emergency Department on 01/24/2021 for the symptoms described in the history of present illness. She was evaluated in  the context of the global COVID-19 pandemic, which necessitated consideration that the patient might be at risk for infection with the SARS-CoV-2 virus that causes COVID-19. Institutional protocols and algorithms that pertain to the evaluation of patients at risk for COVID-19 are in a state of rapid change based on information released by regulatory bodies including the CDC and federal and state organizations. These policies and algorithms were followed during the patient's care in the ED.  Some ED evaluations and interventions may be delayed as a result of limited staffing during and the pandemic.*   Note:  This document was prepared using Dragon voice recognition software and may include unintentional dictation errors.    Sable Feil, PA-C 01/24/21 0756    Vanessa [place], MD 01/24/21 [phone]

## 2021-03-05 ENCOUNTER — Other Ambulatory Visit: Payer: Self-pay

## 2021-03-05 ENCOUNTER — Ambulatory Visit: Payer: 59 | Admitting: Dermatology

## 2021-03-05 DIAGNOSIS — R21 Rash and other nonspecific skin eruption: Secondary | ICD-10-CM | POA: Diagnosis not present

## 2021-03-05 MED ORDER — PERMETHRIN 5 % EX CREA: 1.0000 "application " | TOPICAL_CREAM | Freq: Once | CUTANEOUS | 1 refills | Status: AC

## 2021-03-05 NOTE — Progress Notes (Signed)
   New Patient Visit  Subjective  Shannon Hansen is a 55 y.o. female who presents for the following: New Patient (Initial Visit) (Patient here today for concerns about rash all over except for neck and face. Patient states she has had rash since January. She was prescribed tmc 0.1 ointment, prednisone 5 mg tablet. She has completed prednisone. Patient states she switches detergents in the past but never had problems with rash. Patient states she also use aleo vera lotion and it seems to help and does apple cider vinegar wash. ). Rash is very itchy.    Objective  Well appearing patient in no apparent distress; mood and affect are within normal limits.  A focused examination was performed including arms, legs, abd. Relevant physical exam findings are noted in the Assessment and Plan.  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  All household members/close contacts should be treated at same time.  After treatment with topical Elimite cream applied as directed overnight (and/or oral ivermectin), wash all bed sheets, towels, and recently worn clothing in hot water, and dry on high heat.  Repeat entire process in one week.  Elimite cream 5 % apply chin and hairline down on all areas of body including body folds, use entire tube 1 application for whole body 1 time. 1 refill   Also called Permethrin 5% cream in for patient's husband Nahdia Doucet at [place]  at [employer].  Note written for pt to stay out of work 1 week, until she has had two Elimite treatments    Ordered Medications: permethrin (ELIMITE) 5 % cream  Return in about 2 weeks (around 03/19/2021) for follow up on rash/scabies .  I, Ruthell Rummage, CMA, am acting as scribe for Brendolyn Patty, MD.  Documentation: I have reviewed the above documentation for accuracy and completeness, and I agree with the above.  Brendolyn Patty MD

## 2021-03-05 NOTE — Patient Instructions (Addendum)
Discussed highly contagious condition.  All household members/close contacts should be treated at same time.  After treatment with topical Elimite cream applied as directed overnight (and/or oral ivermectin), wash all bed sheets, towels, and recently worn clothing in hot water, and dry on high heat.  Repeat entire process in one week.

## 2021-03-18 ENCOUNTER — Ambulatory Visit: Payer: 59 | Admitting: Dermatology

## 2021-04-17 ENCOUNTER — Ambulatory Visit: Payer: 59 | Admitting: Dermatology

## 2021-11-24 LAB — COLOGUARD: COLOGUARD: NEGATIVE

## 2021-11-24 LAB — EXTERNAL GENERIC LAB PROCEDURE: COLOGUARD: NEGATIVE

## 2022-01-22 ENCOUNTER — Other Ambulatory Visit: Payer: Self-pay | Admitting: Family Medicine

## 2022-01-22 ENCOUNTER — Other Ambulatory Visit: Payer: Self-pay

## 2022-01-22 ENCOUNTER — Ambulatory Visit
Admission: RE | Admit: 2022-01-22 | Discharge: 2022-01-22 | Disposition: A | Discharge status: Home / Self Care | Payer: 59 | Source: Ambulatory Visit | Attending: Family Medicine | Admitting: Family Medicine

## 2022-01-22 DIAGNOSIS — Z1231 Encounter for screening mammogram for malignant neoplasm of breast: Secondary | ICD-10-CM

## 2022-02-23 IMAGING — MG MM DIGITAL SCREENING BILAT W/ TOMO AND CAD
7 series · 8 of 19 positions shown · non-contrast
Comparison: Previous exam(s).

CLINICAL DATA: Screening.

EXAM:
DIGITAL SCREENING BILATERAL MAMMOGRAM WITH TOMOSYNTHESIS AND CAD
TECHNIQUE: Bilateral screening digital craniocaudal and mediolateral oblique
mammograms were obtained. Bilateral screening digital breast
tomosynthesis was performed. The images were evaluated with
computer-aided detection.

[R MLO synth-2D]
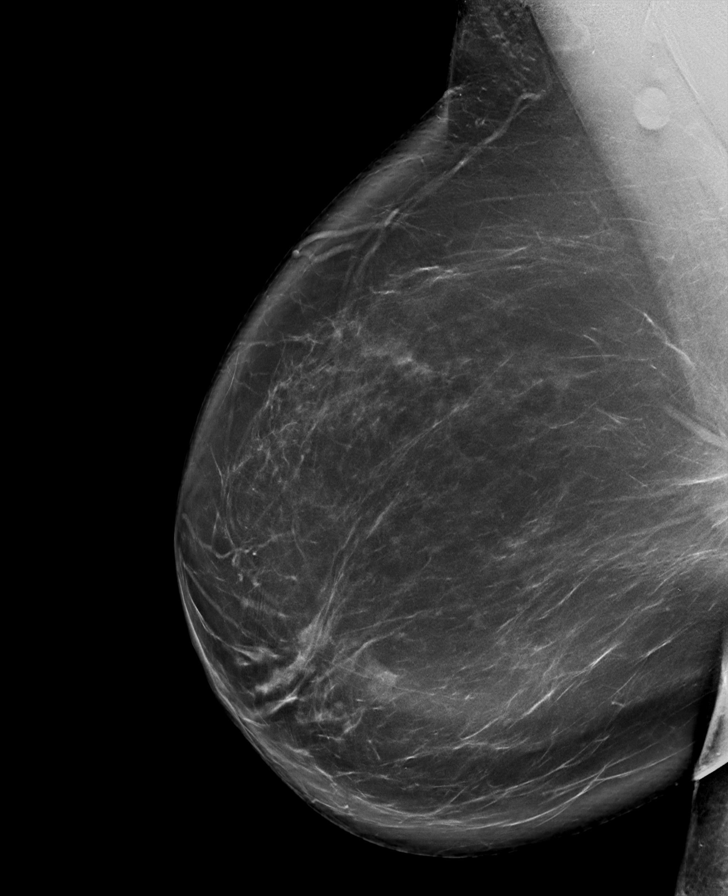

[R CC synth-2D]
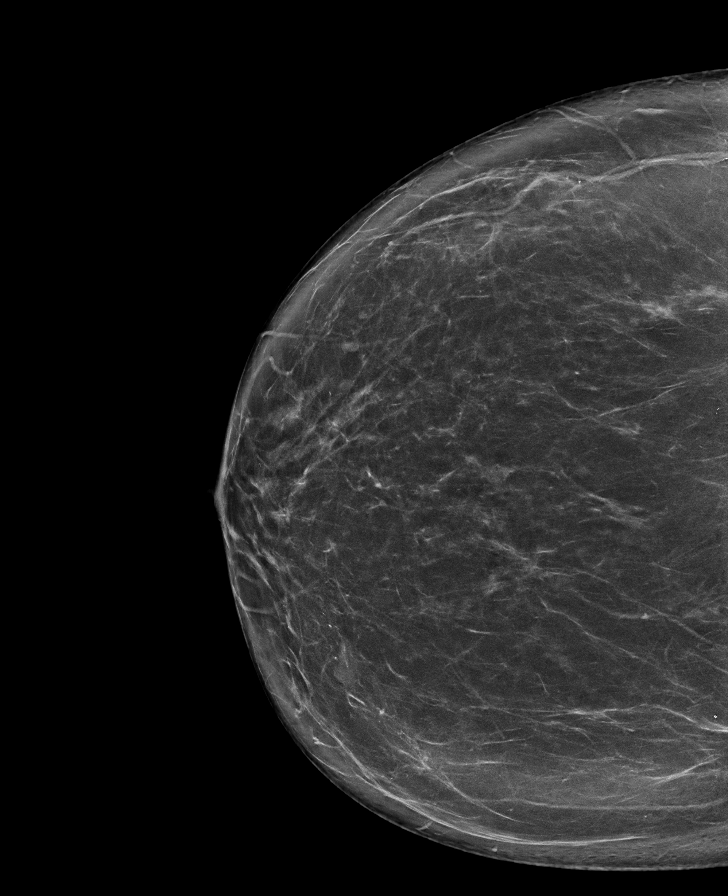

[L MLO synth-2D]
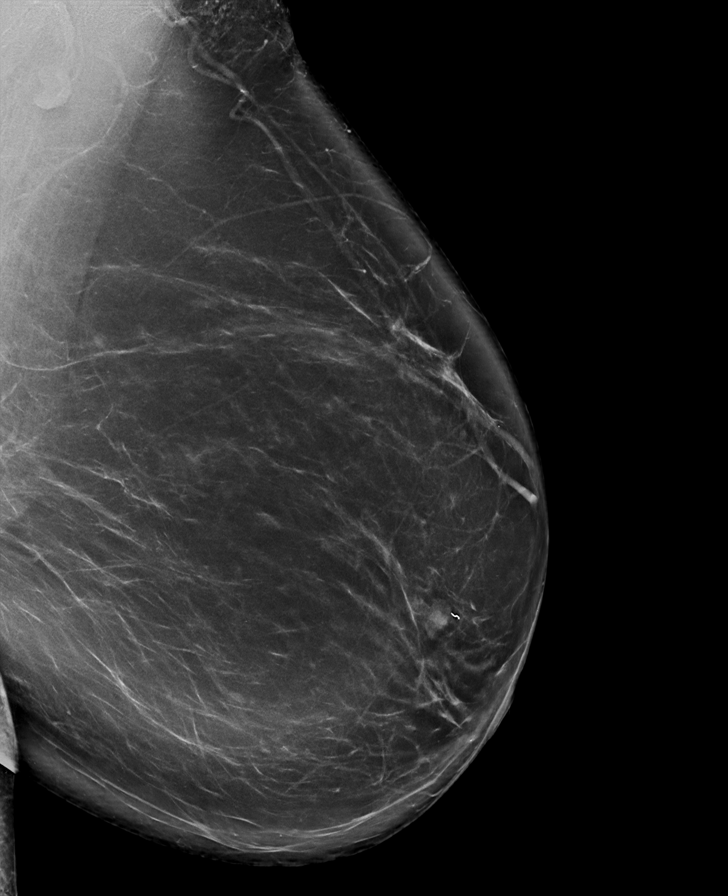

[L CC synth-2D]
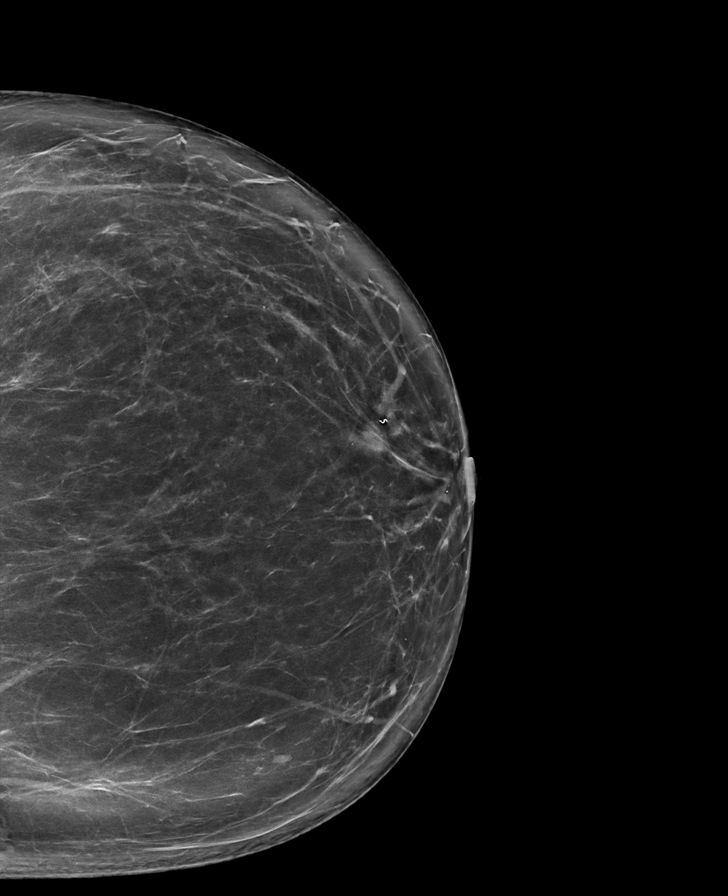

[L CC tomo · 2 of 90 frames shown]
[frame 29/90]
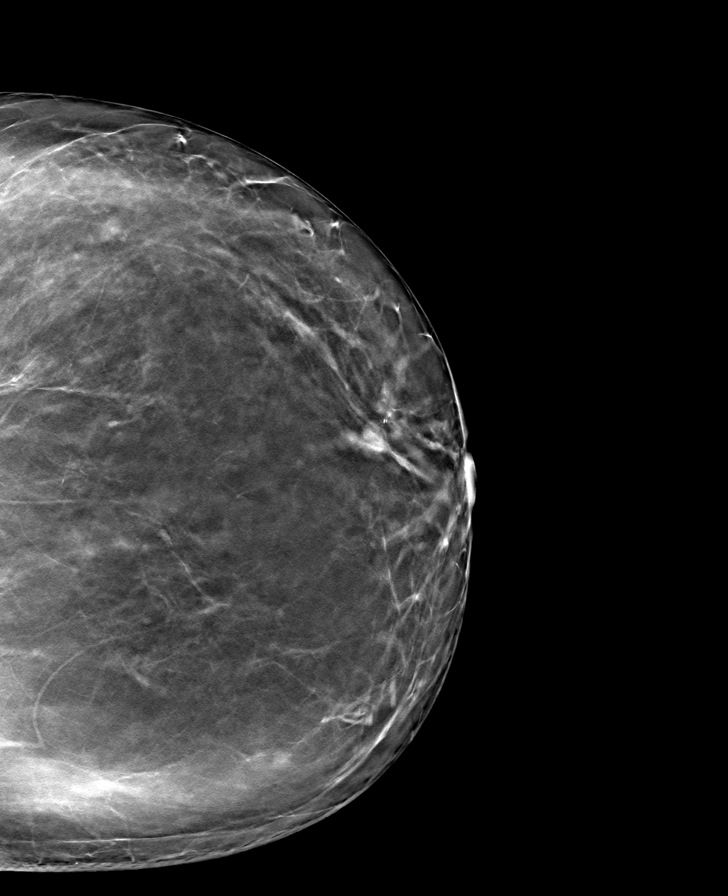
[frame 45/90]
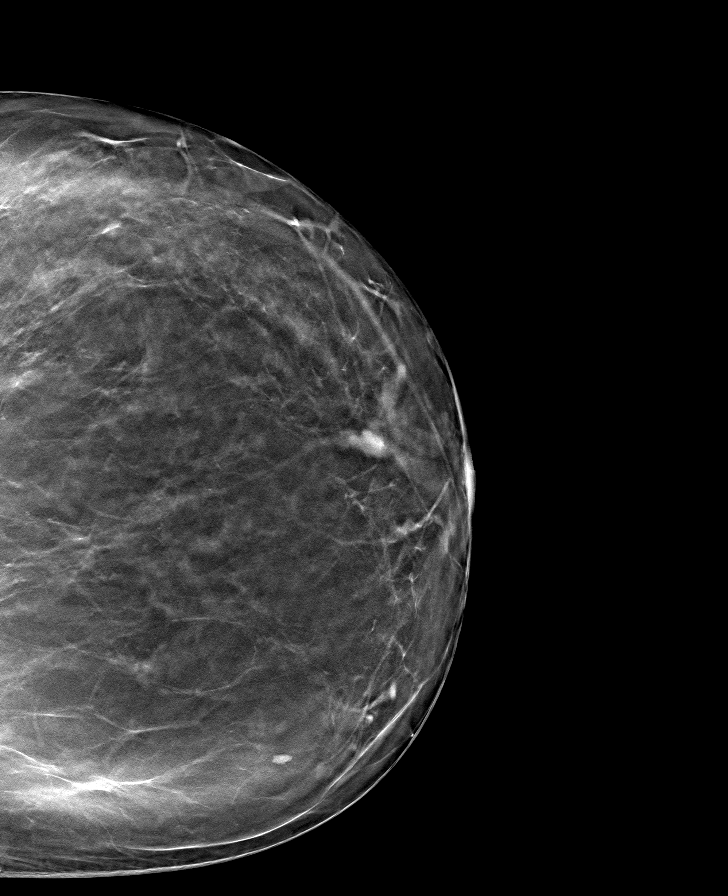

[L MLO tomo · tomo slice 53/106.0]
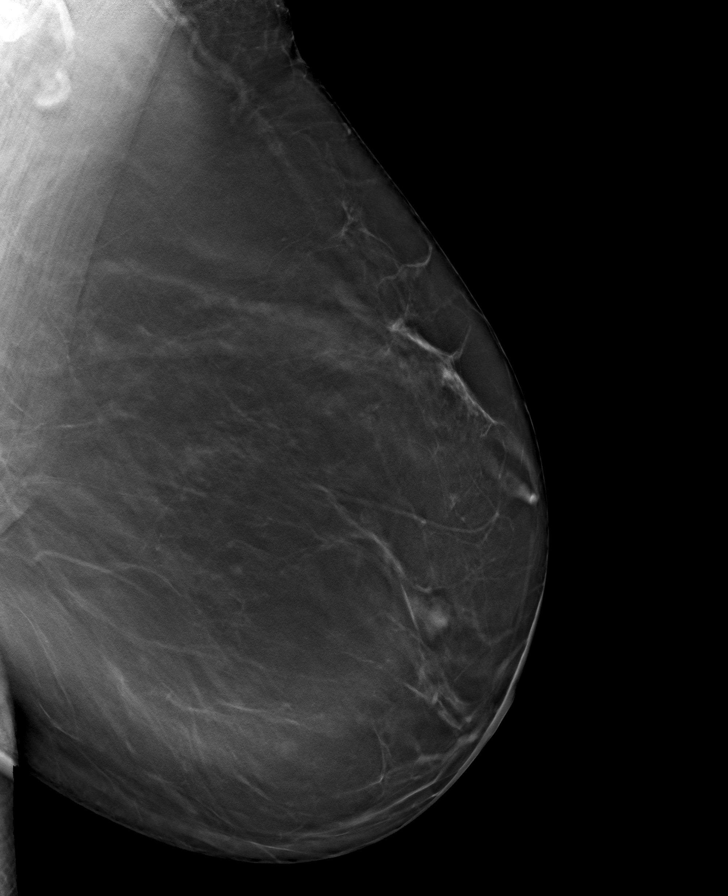

[R CC tomo · tomo slice 47/92.0]
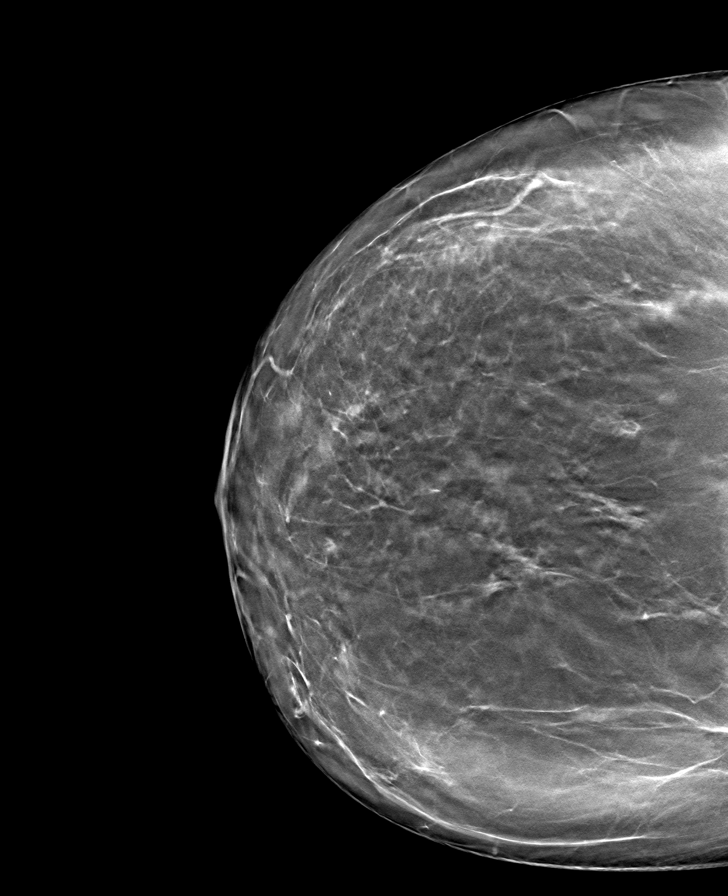

[8 of 19 positions shown; findings below may reference images not displayed]

ACR Breast Density Category b: There are scattered areas of
fibroglandular density.
FINDINGS: There are no findings suspicious for malignancy.
IMPRESSION: No mammographic evidence of malignancy. A result letter of this
screening mammogram will be mailed directly to the patient.

RECOMMENDATION:
Screening mammogram in one year. (Code:51-O-LD2)

BI-RADS CATEGORY  1: Negative.

## 2022-07-15 ENCOUNTER — Encounter: Payer: Self-pay | Admitting: Cardiovascular Disease

## 2022-07-15 ENCOUNTER — Ambulatory Visit (INDEPENDENT_AMBULATORY_CARE_PROVIDER_SITE_OTHER): Payer: 59 | Admitting: Cardiovascular Disease

## 2022-07-15 VITALS — BP 112/75 | HR 75 | Ht 67.0 in | Wt 211.5 lb

## 2022-07-15 DIAGNOSIS — R0602 Shortness of breath: Secondary | ICD-10-CM | POA: Diagnosis not present

## 2022-07-15 DIAGNOSIS — R079 Chest pain, unspecified: Secondary | ICD-10-CM

## 2022-07-15 DIAGNOSIS — R002 Palpitations: Secondary | ICD-10-CM | POA: Diagnosis not present

## 2022-07-15 NOTE — Patient Instructions (Signed)
Medication Instructions:  Your physician recommends that you continue on your current medications as directed. Please refer to the Current Medication list given to you today.  *If you need a refill on your cardiac medications before your next appointment, please call your pharmacy*   Lab Work: None ordered If you have labs (blood work) drawn today and your tests are completely normal, you will receive your results only by: [place] (if you have MyChart) OR A paper copy in the mail If you have any lab test that is abnormal or we need to change your treatment, we will call you to review the results.   Testing/Procedures: Your physician has requested that you have an echocardiogram. Echocardiography is a painless test that uses sound waves to create images of your heart. It provides your doctor with information about the size and shape of your heart and how well your heart's chambers and valves are working. This procedure takes approximately one hour. There are no restrictions for this procedure.    Follow-Up: At [facility], you and your health needs are our priority.  As part of our continuing mission to provide you with exceptional heart care, we have created designated Provider Care Teams.  These Care Teams include your primary Cardiologist (physician) and Advanced Practice Providers (APPs -  Physician Assistants and Nurse Practitioners) who all work together to provide you with the care you need, when you need it.  We recommend signing up for the patient portal called "MyChart".  Sign up information is provided on this After Visit Summary.  MyChart is used to connect with patients for Virtual Visits (Telemedicine).  Patients are able to view lab/test results, encounter notes, upcoming appointments, etc.  Non-urgent messages can be sent to your provider as well.   To learn more about what you can do with MyChart, go to [email or web address].    Your next appointment:   As  needed  The format for your next appointment:   In Person  Provider:   You may see Kathlyn Sacramento, MD or one of the following Advanced Practice Providers on your designated Care Team:   Murray Hodgkins, NP Christell Faith, PA-C Cadence Kathlen Mody, PA-C{     Other Instructions  Echocardiogram An echocardiogram is a test that uses sound waves (ultrasound) to produce images of the heart. Images from an echocardiogram can provide important information about: Heart size and shape. The size and thickness and movement of your heart's walls. Heart muscle function and strength. Heart valve function or if you have stenosis. Stenosis is when the heart valves are too narrow. If blood is flowing backward through the heart valves (regurgitation). A tumor or infectious growth around the heart valves. Areas of heart muscle that are not working well because of poor blood flow or injury from a heart attack. Aneurysm detection. An aneurysm is a weak or damaged part of an artery wall. The wall bulges out from the normal force of blood pumping through the body. Tell a health care provider about: Any allergies you have. All medicines you are taking, including vitamins, herbs, eye drops, creams, and over-the-counter medicines. Any blood disorders you have. Any surgeries you have had. Any medical conditions you have. Whether you are pregnant or may be pregnant. What are the risks? Generally, this is a safe test. However, problems may occur, including an allergic reaction to dye (contrast) that may be used during the test. What happens before the test? No specific preparation is needed. You may eat and drink  normally. What happens during the test?  You will take off your clothes from the waist up and put on a hospital gown. Electrodes or electrocardiogram (ECG)patches may be placed on your chest. The electrodes or patches are then connected to a device that monitors your heart rate and rhythm. You will lie  down on a table for an ultrasound exam. A gel will be applied to your chest to help sound waves pass through your skin. A handheld device, called a transducer, will be pressed against your chest and moved over your heart. The transducer produces sound waves that travel to your heart and bounce back (or "echo" back) to the transducer. These sound waves will be captured in real-time and changed into images of your heart that can be viewed on a video monitor. The images will be recorded on a computer and reviewed by your health care provider. You may be asked to change positions or hold your breath for a short time. This makes it easier to get different views or better views of your heart. In some cases, you may receive an image enhancer  through an IV in one of your veins. This can improve the quality of the pictures from your heart. The procedure may vary among health care providers and hospitals. What can I expect after the test? You may return to your normal, everyday life, including diet, activities, and medicines, unless your health care provider tells you not to do that. Follow these instructions at home: It is up to you to get the results of your test. Ask your health care provider, or the department that is doing the test, when your results will be ready. Keep all follow-up visits. This is important. Summary An echocardiogram is a test that uses sound waves (ultrasound) to produce images of the heart. Images from an echocardiogram can provide important information about the size and shape of your heart, heart muscle function, heart valve function, and other possible heart problems. You do not need to do anything to prepare before this test. You may eat and drink normally. After the echocardiogram is completed, you may return to your normal, everyday life, unless your health care provider tells you not to do that. This information is not intended to replace advice given to you by your health care  provider. Make sure you discuss any questions you have with your health care provider. Document Revised: 08/28/2021 Document Reviewed: 08/07/2020 Elsevier Patient Education  [place].

## 2022-07-15 NOTE — Progress Notes (Signed)
Cardiology Office Note   Date:  07/15/2022   ID:  Shannon Hansen, DOB Oct 08, 1966, MRN [id number]  PCP:  Lorelee Market, MD  Cardiologist:   Kathlyn Sacramento, MD   Chief Complaint  Patient presents with   Other    Palpitations. Meds reviewed verbally with pt.      History of Present Illness: Shannon Hansen is a 56 y.o. female who was referred by Dr. Bernette Mayers for evaluation of palpitations.  She reports no prior cardiac history.  She has known history of hypothyroidism on thyroid replacement therapy, borderline hyperlipidemia and tobacco use.  She is not diabetic and has no history of hypertension.  She chews tobacco but does not smoke. She reports having palpitations and intermittent tachycardia when she was not taking her thyroid medication on a consistent basis.  Once she started taking the medication regularly, her palpitations have resolved completely.  She does complain of mild exertional dyspnea and intermittent episodes of chest pain which she describes as gas sensation most at rest and not with exertion.  No prior cardiac work-up.  Her father had coronary artery disease but that was later in life.  There is no family history of premature coronary artery disease, heart failure or sudden death.    Past Medical History:  Diagnosis Date   Hypothyroid 2014    Past Surgical History:  Procedure Laterality Date   ABDOMINAL HYSTERECTOMY  07/12/2004   partial hyst dysmennorhea Dr Vernie Ammons.  No BSO.   [facility] ER eval  2013   normal elbow, L hip, R hand and l-spine xrays   BREAST BIOPSY Left 2014   benign: fibroadenomatous changes stereotactic   PAROTIDECTOMY  10-1998   left/ Dr Faylene Million     Current Outpatient Medications  Medication Sig Dispense Refill   Acetaminophen-Aspirin Buffered (EXCEDRIN BACK & BODY PO) Take by mouth as directed.     aspirin EC 81 MG tablet Take 81 mg by mouth every other day.     ibuprofen (ADVIL,MOTRIN) 200 MG tablet Take 200 mg by mouth every 6 (six)  hours as needed for pain.     levothyroxine (SYNTHROID) 137 MCG tablet Take 137 mcg by mouth daily.     No current facility-administered medications for this visit.    Allergies:   Patient has no known allergies.    Social History:  The patient  reports that she has never smoked. Her smokeless tobacco use includes chew. She reports that she does not drink alcohol and does not use drugs.   Family History:  The patient's family history includes Cancer in her maternal aunt and maternal grandmother; Diabetes in her brother and mother; Heart disease in her father; Hyperlipidemia in her mother; Hypertension in her mother; Thyroid disease in her mother.    ROS:  Please see the history of present illness.   Otherwise, review of systems are positive for none.   All other systems are reviewed and negative.    PHYSICAL EXAM: VS:  BP 112/75 (BP Location: Right Arm, Patient Position: Sitting, Cuff Size: Normal)   Pulse 75   Ht '5\' 7"'$  (1.702 m)   Wt 211 lb 8 oz (95.9 kg)   SpO2 98%   BMI 33.13 kg/m  , BMI Body mass index is 33.13 kg/m. GEN: Well nourished, well developed, in no acute distress  HEENT: normal  Neck: no JVD, carotid bruits, or masses Cardiac: RRR; no murmurs, rubs, or gallops,no edema  Respiratory:  clear to auscultation bilaterally, normal work of  breathing GI: soft, nontender, nondistended, + BS MS: no deformity or atrophy  Skin: warm and dry, no rash Neuro:  Strength and sensation are intact Psych: euthymic mood, full affect   EKG:  EKG is ordered today. The ekg ordered today demonstrates sinus rhythm with right bundle branch block and inferior T wave changes suggestive of ischemia.   Recent Labs: No results found for requested labs within last 365 days.    Lipid Panel    Component Value Date/Time   CHOL 191 04/29/2010 0956   TRIG 85.0 04/29/2010 0956   HDL 50.70 04/29/2010 0956   CHOLHDL 4 04/29/2010 0956   VLDL 17.0 04/29/2010 0956   LDLCALC 123 (H)  04/29/2010 0956      Wt Readings from Last 3 Encounters:  07/15/22 211 lb 8 oz (95.9 kg)  01/24/21 184 lb 15.5 oz (83.9 kg)  12/06/20 185 lb (83.9 kg)          07/15/2022    3:01 PM  PAD Screen  Previous PAD dx? No  Previous surgical procedure? No  Pain with walking? No  Feet/toe relief with dangling? No  Painful, non-healing ulcers? No  Extremities discolored? No      ASSESSMENT AND PLAN:  1.  Palpitations: She reports complete resolution of palpitations since she started taking levothyroxine on a regular basis.  Given that her symptoms are resolved, there is little utility from outpatient monitor.  2.  Exertional dyspnea with atypical chest pain: She does have an abnormal EKG with right bundle branch block and anterior T wave changes.  I requested an echocardiogram.  I also discussed with her the indication for ischemic cardiac evaluation either with a nuclear stress test or a cardiac CTA.  The patient is hesitant to undergo a stress test or anything that requires an IV.  Unfortunately, given abnormal baseline EKG, a treadmill stress test alone is not going to be sufficient.  She will let us know if she changes her mind.    Disposition:   FU with me as needed.  Signed,  Kathlyn Sacramento, MD  07/15/2022 5:23 PM    [place]

## 2022-08-19 ENCOUNTER — Ambulatory Visit (INDEPENDENT_AMBULATORY_CARE_PROVIDER_SITE_OTHER): Payer: 59

## 2022-08-19 DIAGNOSIS — R079 Chest pain, unspecified: Secondary | ICD-10-CM

## 2022-08-19 DIAGNOSIS — R0602 Shortness of breath: Secondary | ICD-10-CM

## 2022-08-19 LAB — ECHOCARDIOGRAM COMPLETE
AR max vel: 3.31 cm2
AV Area VTI: 3.09 cm2
AV Area mean vel: 3.37 cm2
AV Mean grad: 3 mmHg
AV Peak grad: 4.7 mmHg
Ao pk vel: 1.08 m/s
Area-P 1/2: 3.56 cm2
Calc EF: 50.6 %
S' Lateral: 2.9 cm
Single Plane A2C EF: 51.7 %
Single Plane A4C EF: 52.7 %

## 2022-08-21 ENCOUNTER — Telehealth: Payer: Self-pay

## 2022-08-21 NOTE — Telephone Encounter (Signed)
DPR on file. Lmom with echo results. Patient is to contact the office if any questions.

## 2022-08-21 NOTE — Telephone Encounter (Signed)
-----   Message from Wellington Hampshire, MD sent at 08/20/2022  3:30 PM EDT ----- Inform patient that echo was normal.

## 2022-08-21 NOTE — Telephone Encounter (Signed)
Pt is returning call.  

## 2022-08-21 NOTE — Telephone Encounter (Signed)
Patient made aware of echo results with verbalized understanding. 

## 2023-02-19 ENCOUNTER — Ambulatory Visit: Payer: 59 | Admitting: Family

## 2023-02-19 ENCOUNTER — Encounter: Payer: Self-pay | Admitting: Family

## 2023-02-19 VITALS — BP 140/80 | HR 92 | Temp 97.8°F | Ht 67.0 in | Wt 215.6 lb

## 2023-02-19 DIAGNOSIS — R8290 Unspecified abnormal findings in urine: Secondary | ICD-10-CM | POA: Diagnosis not present

## 2023-02-19 DIAGNOSIS — E6609 Other obesity due to excess calories: Secondary | ICD-10-CM

## 2023-02-19 DIAGNOSIS — E782 Mixed hyperlipidemia: Secondary | ICD-10-CM | POA: Diagnosis not present

## 2023-02-19 DIAGNOSIS — E66811 Other obesity due to excess calories: Secondary | ICD-10-CM

## 2023-02-19 DIAGNOSIS — R8271 Bacteriuria: Secondary | ICD-10-CM

## 2023-02-19 DIAGNOSIS — R7303 Prediabetes: Secondary | ICD-10-CM | POA: Diagnosis not present

## 2023-02-19 DIAGNOSIS — I8390 Asymptomatic varicose veins of unspecified lower extremity: Secondary | ICD-10-CM | POA: Diagnosis not present

## 2023-02-19 DIAGNOSIS — M79662 Pain in left lower leg: Secondary | ICD-10-CM | POA: Diagnosis not present

## 2023-02-19 DIAGNOSIS — Z6833 Body mass index (BMI) 33.0-33.9, adult: Secondary | ICD-10-CM

## 2023-02-19 DIAGNOSIS — N3000 Acute cystitis without hematuria: Secondary | ICD-10-CM

## 2023-02-19 DIAGNOSIS — M545 Low back pain, unspecified: Secondary | ICD-10-CM

## 2023-02-19 DIAGNOSIS — G8929 Other chronic pain: Secondary | ICD-10-CM

## 2023-02-19 DIAGNOSIS — F17220 Nicotine dependence, chewing tobacco, uncomplicated: Secondary | ICD-10-CM

## 2023-02-19 DIAGNOSIS — L84 Corns and callosities: Secondary | ICD-10-CM

## 2023-02-19 DIAGNOSIS — E039 Hypothyroidism, unspecified: Secondary | ICD-10-CM

## 2023-02-19 DIAGNOSIS — Z1231 Encounter for screening mammogram for malignant neoplasm of breast: Secondary | ICD-10-CM

## 2023-02-19 DIAGNOSIS — R311 Benign essential microscopic hematuria: Secondary | ICD-10-CM

## 2023-02-19 LAB — POC URINALSYSI DIPSTICK (AUTOMATED)
Bilirubin, UA: NEGATIVE
Glucose, UA: NEGATIVE
Ketones, UA: NEGATIVE
Leukocytes, UA: NEGATIVE
Protein, UA: POSITIVE — AB
Spec Grav, UA: 1.02 (ref 1.010–1.025)
Urobilinogen, UA: 1 E.U./dL
pH, UA: 6 (ref 5.0–8.0)

## 2023-02-19 LAB — LIPID PANEL
Cholesterol: 213 mg/dL — ABNORMAL HIGH (ref 0–200)
HDL: 60.1 mg/dL (ref >39.00)
LDL Cholesterol: 132 mg/dL — ABNORMAL HIGH (ref 0–99)
NonHDL: 152.79
Total CHOL/HDL Ratio: 4
Triglycerides: 103 mg/dL (ref 0.0–149.0)
VLDL: 20.6 mg/dL (ref 0.0–40.0)

## 2023-02-19 MED ORDER — MELOXICAM 7.5 MG PO TABS: 7.5000 mg | ORAL_TABLET | Freq: Every day | ORAL | 3 refills | Status: DC

## 2023-02-19 MED ORDER — NICOTINE POLACRILEX 2 MG MT GUM: 2.0000 mg | CHEWING_GUM | OROMUCOSAL | 0 refills | Status: DC | PRN

## 2023-02-19 NOTE — Assessment & Plan Note (Signed)
Continue current dosage levothyroxine 137 mcg once daily on 6 days 1/2 tablet on 7th day  Will repeat tsh today pending results

## 2023-02-19 NOTE — Assessment & Plan Note (Signed)
Ordered lipid panel, pending results. Work on low cholesterol diet and exercise as tolerated ? ?

## 2023-02-19 NOTE — Assessment & Plan Note (Signed)
Pt advised of the following: Work on a diabetic diet, try to incorporate exercise at least 20-30 a day for 3 days a week or more.  A1c ordered today pending results.

## 2023-02-19 NOTE — Progress Notes (Signed)
New Patient Office Visit  Subjective:  Patient ID: Shannon Hansen, female    DOB: 12-27-1966  Age: 57 y.o. MRN: [id number]  CC:  Chief Complaint  Patient presents with   Establish Care    HPI Shannon Hansen is here to establish care as a new patient.  Oriented to practice routines and expectations.  Prior provider was: Tyler Deis was seeing pcp but wanted to switch. She was a previous patient of ours back in 2015 with Dr. Danise Mina.   Pt is with acute concerns.   Left ankle edema, chronic, states has been on and off with sitting/standing since 57 y/o when she had an ankle injury.   01/23/22 mammogram   chronic concerns:  Varicose veins left lower calf and painful.   Hypothyroid: on levothyroxine 137 mcg once daily. She is currently taking 137 mcg x six days and then half tablet on the seventh day   Urinary incontinence, ongoing, over the last few days with urinary odor. Also with some urinary frequency and urgency. No dysuria. No flank pains, and or fever chills  Obesity: worried about her weight. Doesn't exercise she works third shift, she states hard to find time to eat. She doesn't necessarily work on her diet since she is on the go.   Arthritis: states on daily meloxicam 7.5 mg once daily. This is helpful for her   Chewing tobacco, trying to quit but needs help.   ROS: Negative unless specifically indicated above in HPI.   Current Outpatient Medications:    aspirin EC 81 MG tablet, Take 81 mg by mouth every other day., Disp: , Rfl:    ibuprofen (ADVIL,MOTRIN) 200 MG tablet, Take 200 mg by mouth every 6 (six) hours as needed for pain., Disp: , Rfl:    levothyroxine (SYNTHROID) 137 MCG tablet, Take 137 mcg by mouth daily., Disp: , Rfl:    meloxicam (MOBIC) 7.5 MG tablet, Take 1 tablet (7.5 mg total) by mouth daily., Disp: 90 tablet, Rfl: 3   nicotine polacrilex (NICORETTE) 2 MG gum, Take 1 each (2 mg total) by mouth as needed for smoking cessation., Disp: 100 tablet, Rfl: 0 Past  Medical History:  Diagnosis Date   Hypothyroid 2014   Past Surgical History:  Procedure Laterality Date   ABDOMINAL HYSTERECTOMY  07/12/2004   partial hyst dysmennorhea Dr Vernie Ammons.  No BSO.   BREAST BIOPSY Right 12/29/2012   benign: fibroadenomatous changes stereotactic   PAROTIDECTOMY  10/29/1998   left/ Dr Faylene Million    Objective:   Today's Vitals: BP (!) 140/80 Comment: right  Pulse 92   Temp 97.8 F (36.6 C) (Temporal)   Ht 5' 7"$  (1.702 m)   Wt 215 lb 9.6 oz (97.8 kg)   SpO2 99%   BMI 33.77 kg/m   Physical Exam Vitals reviewed.  Constitutional:      General: She is not in acute distress.    Appearance: Normal appearance. She is normal weight. She is not ill-appearing.  HENT:     Head: Normocephalic.     Right Ear: Tympanic membrane normal.     Left Ear: Tympanic membrane normal.     Nose: Nose normal.     Mouth/Throat:     Mouth: Mucous membranes are moist.  Eyes:     Extraocular Movements: Extraocular movements intact.     Pupils: Pupils are equal, round, and reactive to light.  Cardiovascular:     Rate and Rhythm: Normal rate and regular rhythm.     Pulses:  Normal pulses. No decreased pulses.     Comments: Painful slightly thrombosed varicose veins right upper area of calf   Very slight non pitting 1+ edema left ankle Pulmonary:     Effort: Pulmonary effort is normal.     Breath sounds: Normal breath sounds.  Abdominal:     General: Abdomen is flat. Bowel sounds are normal.     Palpations: Abdomen is soft.     Tenderness: There is no guarding or rebound.  Musculoskeletal:        General: Normal range of motion.     Cervical back: Normal range of motion.  Feet:     Left foot:     Skin integrity: Callus (painful right medal pad of foot) present.  Skin:    General: Skin is warm.     Capillary Refill: Capillary refill takes less than 2 seconds.  Neurological:     General: No focal deficit present.     Mental Status: She is alert.  Psychiatric:         Mood and Affect: Mood normal.        Behavior: Behavior normal.        Thought Content: Thought content normal.        Judgment: Judgment normal.     Assessment & Plan:  Varicose veins of calf Assessment & Plan: Referral placed for vascular may need laser therapy    Pain in left lower leg  Prediabetes Assessment & Plan: Pt advised of the following: Work on a diabetic diet, try to incorporate exercise at least 20-30 a day for 3 days a week or more.  A1c ordered today pending results.  Orders: -     Amb Ref to Medical Weight Management -     Hemoglobin A1c; Future  Class 1 obesity due to excess calories with serious comorbidity and body mass index (BMI) of 33.0 to 33.9 in adult Assessment & Plan: Pt advised to work on diet and exercise as tolerated Referral placed for medical weight loss management  Orders: -     Amb Ref to Medical Weight Management  Mixed hyperlipidemia Assessment & Plan: Ordered lipid panel, pending results. 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 If not covered did inform pt would be otc.    Orders: -     Nicotine Polacrilex; Take 1 each (2 mg total) by mouth as needed for smoking cessation.  Dispense: 100 tablet; Refill: 0  Chronic bilateral low back pain without sciatica Assessment & Plan: Refill for melodic 7.5 mg once daily.  Orders: -     Meloxicam; Take 1 tablet (7.5 mg total) by mouth daily.  Dispense: 90 tablet; Refill: 3  Abnormal urine odor -     POCT Urinalysis Dipstick (Automated)  Callus of  foot Assessment & Plan: Recommendation for appropriate shows, inserts, callous corn pads, referral to podiatry also placed.  Can apply topical otc ointment for calluses   Orders: -     Ambulatory referral to Podiatry  Acquired hypothyroidism Assessment & Plan: Continue current dosage levothyroxine 137 mcg once daily  on 6 days 1/2 tablet on 7th day  Will repeat tsh today pending results      Follow-up: Return in about 6 months (around 08/20/2023) for f/u thyroid.   Eugenia Pancoast, FNP

## 2023-02-19 NOTE — Assessment & Plan Note (Signed)
Referral placed for vascular may need laser therapy

## 2023-02-19 NOTE — Patient Instructions (Addendum)
------------------------------------   A referral was placed today for podiatry, vascular for your varicose veins, and also medical weight management.  Please let us know if you have not heard back within 2 weeks about the referral.  ------------------------------------ A referral was placed today. Please let us know if you have not heard back within 2 weeks about the referral.  Welcome to our clinic, I am happy to have you as my new patient. I am excited to continue on this healthcare journey with you. I have sent an electronic order over to your preferred location for the following:   []$   2D Mammogram  [x]$   3D Mammogram  []$   Bone Density   Please give this center a call to get scheduled at your convenience.  [x]$   [facility]  [place] [address]  [phone]  Make sure to wear two piece  clothing  No lotions powders or deodorants the day of the appointment Make sure to bring picture ID and insurance card.  Bring list of medications you are currently taking including any supplements.   ------------------------------------  Stop by the lab prior to leaving today. I will notify you of your results once received.   Please keep in mind Any my chart messages you send have up to a three business day turnaround for a response.  Phone calls may take up to a one full business day turnaround for a  response.   If you need a medication refill I recommend you request it through the pharmacy as this is easiest for [place] rather than sending a message and or phone call.   Due to recent changes in healthcare laws, you may see results of your imaging and/or laboratory studies on MyChart before I have had a chance to review them.  I understand that in some cases there may be results that are confusing or concerning to you. Please understand that not all results are received at the same time and often I may need to interpret  multiple results in order to provide you with the best plan of care or course of treatment. Therefore, I ask that you please give me 2 business days to thoroughly review all your results before contacting my office for clarification. Should we see a critical lab result, you will be contacted sooner.   It was a pleasure seeing you today! Please do not hesitate to reach out with any questions and or concerns.  Regards,   Eugenia Pancoast FNP-C

## 2023-02-19 NOTE — Assessment & Plan Note (Signed)
Smoking cessation instruction/counseling given:  counseled patient on the dangers of tobacco use, advised patient to stop smoking, and reviewed strategies to maximize successpt willing to try nicotine gum, rx sent to pharmacy. If not covered did inform pt would be otc.

## 2023-02-19 NOTE — Assessment & Plan Note (Signed)
Pt advised to work on diet and exercise as tolerated Referral placed for medical weight loss management

## 2023-02-19 NOTE — Assessment & Plan Note (Signed)
Refill for melodic 7.5 mg once daily.

## 2023-02-19 NOTE — Assessment & Plan Note (Signed)
Recommendation for appropriate shows, inserts, callous corn pads, referral to podiatry also placed.  Can apply topical otc ointment for calluses

## 2023-02-19 NOTE — Progress Notes (Signed)
LDL of cholesterol with some elevation, goal <100. Work on low cholesterol diet, we will continue to monitor and repeat at next f/u visit so come fasting to next visit.

## 2023-02-21 LAB — URINE CULTURE
MICRO NUMBER:: 14601221
SPECIMEN QUALITY:: ADEQUATE

## 2023-02-23 MED ORDER — SULFAMETHOXAZOLE-TRIMETHOPRIM 800-160 MG PO TABS: 1.0000 | ORAL_TABLET | Freq: Two times a day (BID) | ORAL | 0 refills | Status: AC

## 2023-02-23 NOTE — Addendum Note (Signed)
Addended by: Eugenia Pancoast on: 02/23/2023 02:05 PM   Modules accepted: Orders

## 2023-02-23 NOTE — Progress Notes (Signed)
Urine positive for UTI  Sending in bactrim to pharmacy pt to get started on this antbx and increase water intake.

## 2023-03-13 ENCOUNTER — Encounter: Payer: Self-pay | Admitting: Podiatry

## 2023-03-13 ENCOUNTER — Ambulatory Visit (INDEPENDENT_AMBULATORY_CARE_PROVIDER_SITE_OTHER): Payer: 59 | Admitting: Podiatry

## 2023-03-13 VITALS — BP 134/85 | HR 72

## 2023-03-13 DIAGNOSIS — B351 Tinea unguium: Secondary | ICD-10-CM | POA: Diagnosis not present

## 2023-03-13 DIAGNOSIS — M79675 Pain in left toe(s): Secondary | ICD-10-CM | POA: Diagnosis not present

## 2023-03-13 DIAGNOSIS — M79674 Pain in right toe(s): Secondary | ICD-10-CM | POA: Diagnosis not present

## 2023-03-13 MED ORDER — CICLOPIROX 8 % EX SOLN
Freq: Every day | CUTANEOUS | 0 refills | Status: AC
Start: 2023-03-13 — End: ?

## 2023-03-13 NOTE — Progress Notes (Signed)
  Subjective:  Patient ID: Shannon Hansen, female    DOB: 1966-08-31,  MRN: [id number]  Chief Complaint  Patient presents with   Callouses    "I have a callus on the ball of this foot." N - callus  L - 4th metatarsal left D - 2 mos O - gradually worse C - soreness A - none T - soak in Epsom Salt water   Nail Problem    "I can't cut my toenails. I have fungus." N - toenails  L - 1-5 bilateral D - 3-4 yrs O - gradually worse C - discolored, thick, ingrown A - shoe, socks T - none   57 y.o. female returns for the above complaint. Patient presents with thickened and negative dystrophic toenails x 10 mild pain on palpation hurts with ambulation worse with pressure she has not seen MRIs prior to seeing me she would like for me to be done she is not able to do it herself.  Denies any other acute complaints  Objective:   Vitals:   03/13/23 1029  BP: 134/85  Pulse: 72   Podiatric Exam: Vascular: dorsalis pedis and posterior tibial pulses are palpable bilateral. Capillary return is immediate. Temperature gradient is WNL. Skin turgor WNL  Sensorium: Normal Semmes Weinstein monofilament test. Normal tactile sensation bilaterally. Nail Exam: Pt has thick disfigured discolored nails with subungual debris noted bilateral entire nail hallux through fifth toenails.  Pain on palpation to the nails. Ulcer Exam: There is no evidence of ulcer or pre-ulcerative changes or infection. Orthopedic Exam: Muscle tone and strength are WNL. No limitations in general ROM. No crepitus or effusions noted.  Skin: No Porokeratosis. No infection or ulcers    Assessment & Plan:   1. Pain due to onychomycosis of toenails of both feet     Patient was evaluated and treated and all questions answered.  Onychomycosis with pain  -Nails palliatively debrided as below. -Educated on self-care  Procedure: Nail Debridement Rationale: pain  Type of Debridement: manual, sharp debridement. Instrumentation: Nail  nipper, rotary burr. Number of Nails: 10  Procedures and Treatment: Consent by patient was obtained for treatment procedures. The patient understood the discussion of treatment and procedures well. All questions were answered thoroughly reviewed. Debridement of mycotic and hypertrophic toenails, 1 through 5 bilateral and clearing of subungual debris. No ulceration, no infection noted.  Return Visit-Office Procedure: Patient instructed to return to the office for a follow up visit 3 months for continued evaluation and treatment.  Boneta Lucks, DPM    No follow-ups on file.

## 2023-06-29 ENCOUNTER — Other Ambulatory Visit: Payer: Self-pay | Admitting: Family

## 2023-06-29 DIAGNOSIS — E039 Hypothyroidism, unspecified: Secondary | ICD-10-CM

## 2023-06-29 NOTE — Telephone Encounter (Signed)
Prescription Request  06/29/2023  LOV: 02/19/2023  What is the name of the medication or equipment?  levothyroxine (SYNTHROID) 137 MCG tablet  Have you contacted your pharmacy to request a refill? Yes   Which pharmacy would you like this sent to?   [id number] [place], [place] - [address] AT [facility] OF [address] [place] [address] Phone: [phone] Fax: [phone]    Patient notified that their request is being sent to the clinical staff for review and that they should receive a response within 2 business days.   Please advise at Mobile [phone] (mobile)

## 2023-06-30 ENCOUNTER — Ambulatory Visit: Payer: 59 | Admitting: Family

## 2023-06-30 ENCOUNTER — Encounter: Payer: Self-pay | Admitting: Family

## 2023-06-30 ENCOUNTER — Ambulatory Visit (INDEPENDENT_AMBULATORY_CARE_PROVIDER_SITE_OTHER)
Admission: RE | Admit: 2023-06-30 | Discharge: 2023-06-30 | Disposition: A | Discharge status: Home / Self Care | Payer: 59 | Source: Ambulatory Visit | Attending: Family | Admitting: Family

## 2023-06-30 VITALS — BP 130/80 | HR 80 | Temp 97.9°F | Ht 67.0 in | Wt 208.0 lb

## 2023-06-30 DIAGNOSIS — I8390 Asymptomatic varicose veins of unspecified lower extremity: Secondary | ICD-10-CM

## 2023-06-30 DIAGNOSIS — M25571 Pain in right ankle and joints of right foot: Secondary | ICD-10-CM | POA: Diagnosis not present

## 2023-06-30 DIAGNOSIS — R600 Localized edema: Secondary | ICD-10-CM

## 2023-06-30 DIAGNOSIS — Z1211 Encounter for screening for malignant neoplasm of colon: Secondary | ICD-10-CM

## 2023-06-30 DIAGNOSIS — E6609 Other obesity due to excess calories: Secondary | ICD-10-CM

## 2023-06-30 DIAGNOSIS — Z6833 Body mass index (BMI) 33.0-33.9, adult: Secondary | ICD-10-CM

## 2023-06-30 DIAGNOSIS — R7303 Prediabetes: Secondary | ICD-10-CM | POA: Diagnosis not present

## 2023-06-30 DIAGNOSIS — E039 Hypothyroidism, unspecified: Secondary | ICD-10-CM | POA: Diagnosis not present

## 2023-06-30 DIAGNOSIS — I739 Peripheral vascular disease, unspecified: Secondary | ICD-10-CM

## 2023-06-30 LAB — BASIC METABOLIC PANEL
BUN: 9 mg/dL (ref 6–23)
CO2: 28 mEq/L (ref 19–32)
Calcium: 9.5 mg/dL (ref 8.4–10.5)
Chloride: 103 mEq/L (ref 96–112)
Creatinine, Ser: 1.14 mg/dL (ref 0.40–1.20)
GFR: 52.74 mL/min — ABNORMAL LOW (ref >60.00)
Glucose, Bld: 97 mg/dL (ref 70–99)
Potassium: 4.1 mEq/L (ref 3.5–5.1)
Sodium: 139 mEq/L (ref 135–145)

## 2023-06-30 LAB — HEMOGLOBIN A1C: Hgb A1c MFr Bld: 6.2 % (ref 4.6–6.5)

## 2023-06-30 LAB — TSH: TSH: 0.11 u[IU]/mL — ABNORMAL LOW (ref 0.35–5.50)

## 2023-06-30 NOTE — Assessment & Plan Note (Signed)
Painful at times, referral placed for vascular surgeon for eval/treat

## 2023-06-30 NOTE — Assessment & Plan Note (Signed)
Continue levothyroxine 137 mcg once daily  Ordering tsh and free t4, has been a long time since last checked pending results.  R/o if this is contributing to inability to lose weight

## 2023-06-30 NOTE — Assessment & Plan Note (Signed)
Pt advised of the following: Work on a diabetic diet, try to incorporate exercise at least 20-30 a day for 3 days a week or more.   
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
## 2023-06-30 NOTE — Addendum Note (Signed)
Addended by: Eual Fines on: 06/30/2023 04:15 PM   Modules accepted: Orders

## 2023-06-30 NOTE — Assessment & Plan Note (Signed)
Pt advised to work on diet and exercise as tolerated Discussed with her ideas for nutritional management, pending tsh results, may recommend referral to weight loss clinic.

## 2023-06-30 NOTE — Telephone Encounter (Signed)
Patient contacted the office regarding this request, asked for an update. States she is out of this medication and forgot to mention when she saw [place] today. Please advise, thank you.

## 2023-06-30 NOTE — Patient Instructions (Signed)
  A referral was placed today for colonoscopy  Please let us know if you have not heard back within 2 weeks about the referral.   Regards,   Mahari Strahm FNP-C   

## 2023-07-01 ENCOUNTER — Other Ambulatory Visit: Payer: Self-pay | Admitting: Family

## 2023-07-01 DIAGNOSIS — R944 Abnormal results of kidney function studies: Secondary | ICD-10-CM

## 2023-07-01 DIAGNOSIS — E039 Hypothyroidism, unspecified: Secondary | ICD-10-CM

## 2023-07-01 MED ORDER — LEVOTHYROXINE SODIUM 125 MCG PO TABS: 125.0000 ug | ORAL_TABLET | Freq: Every day | ORAL | 0 refills | Status: DC

## 2023-07-01 NOTE — Telephone Encounter (Signed)
Pt is scheduled for 01-04-24.

## 2023-07-01 NOTE — Progress Notes (Signed)
Does pt drink a good amount of water, any urinary symptoms?  Kidney function slightly decreased.  Have pt increase water intake and make a lab only to repeat kidney function in one month. Order in.   She is prediabetic, work on diabetic diet.   Thyroid lower than we want.  Decreased thyroid medication to 125 mcg once daily from 137. New RX sent to pharmacy   We will also repeat the thyroid level at that repeat lab visit.

## 2023-07-01 NOTE — Addendum Note (Signed)
Addended by: Mort Sawyers on: 07/01/2023 12:21 PM   Modules accepted: Orders
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
## 2023-07-08 ENCOUNTER — Encounter: Payer: Self-pay | Admitting: *Deleted

## 2023-07-09 NOTE — Progress Notes (Signed)
It does appear she already is seeing Dr. Allena Katz podiatry? Last seen in March. She can call their office.

## 2023-08-05 ENCOUNTER — Other Ambulatory Visit (INDEPENDENT_AMBULATORY_CARE_PROVIDER_SITE_OTHER): Payer: 59

## 2023-08-05 DIAGNOSIS — R944 Abnormal results of kidney function studies: Secondary | ICD-10-CM | POA: Diagnosis not present

## 2023-08-05 DIAGNOSIS — E039 Hypothyroidism, unspecified: Secondary | ICD-10-CM

## 2023-08-05 LAB — BASIC METABOLIC PANEL
BUN: 11 mg/dL (ref 6–23)
CO2: 31 mEq/L (ref 19–32)
Calcium: 9.5 mg/dL (ref 8.4–10.5)
Chloride: 104 mEq/L (ref 96–112)
Creatinine, Ser: 1.14 mg/dL (ref 0.40–1.20)
GFR: 52.71 mL/min — ABNORMAL LOW (ref >60.00)
Glucose, Bld: 99 mg/dL (ref 70–99)
Potassium: 4.1 mEq/L (ref 3.5–5.1)
Sodium: 141 mEq/L (ref 135–145)

## 2023-08-05 LAB — TSH: TSH: 0.04 u[IU]/mL — ABNORMAL LOW (ref 0.35–5.50)

## 2023-08-06 ENCOUNTER — Other Ambulatory Visit: Payer: Self-pay | Admitting: Family

## 2023-08-06 DIAGNOSIS — E039 Hypothyroidism, unspecified: Secondary | ICD-10-CM

## 2023-08-06 MED ORDER — LEVOTHYROXINE SODIUM 112 MCG PO TABS: 112.0000 ug | ORAL_TABLET | Freq: Every day | ORAL | 2 refills | Status: DC

## 2023-08-06 NOTE — Progress Notes (Signed)
Kidneys are stable we will continue to monitor.  Thyroid is on the lower end of normal. Did she decrease the previous levothyroxine to 125 mcg once daily? We need to decrease again to 112 mcg once daily. Come in for lab only in six weeks and repeat thyroid level again until we are at goal.

## 2023-08-18 ENCOUNTER — Telehealth: Payer: Self-pay | Admitting: Family

## 2023-08-18 NOTE — Telephone Encounter (Signed)
Unable to reach pt and left v/m for pt to call [phone].

## 2023-08-18 NOTE — Telephone Encounter (Signed)
Can we make sure this has been going on for the last month and not within recent amount of days?   This would be an odd change from decreasing the thyroid medication.  If non urgent have pt schedule appt to evaluate further. Has she also checked blood pressure? Does she have any palpitations?

## 2023-08-18 NOTE — Telephone Encounter (Signed)
Patient called in and stated since changine her medication levothyroxine (SYNTHROID) 112 MCG tablet  she has been light headed and experiencing some blurry vision. patient was wonder what to do know.

## 2023-08-19 NOTE — Telephone Encounter (Signed)
Unable to reach pt by phone and left v/m for pt to cb [phone].sending note to lsc triage

## 2023-08-20 ENCOUNTER — Encounter: Payer: Self-pay | Admitting: Intensive Care

## 2023-08-20 ENCOUNTER — Other Ambulatory Visit: Payer: Self-pay

## 2023-08-20 ENCOUNTER — Emergency Department: Payer: 59

## 2023-08-20 ENCOUNTER — Emergency Department
Admission: EM | Admit: 2023-08-20 | Discharge: 2023-08-20 | Disposition: A | Discharge status: Home / Self Care | Payer: 59 | Attending: Emergency Medicine | Admitting: Emergency Medicine

## 2023-08-20 DIAGNOSIS — J0100 Acute maxillary sinusitis, unspecified: Secondary | ICD-10-CM | POA: Insufficient documentation

## 2023-08-20 DIAGNOSIS — R42 Dizziness and giddiness: Secondary | ICD-10-CM

## 2023-08-20 LAB — URINALYSIS, ROUTINE W REFLEX MICROSCOPIC
Bilirubin Urine: NEGATIVE
Glucose, UA: NEGATIVE mg/dL
Ketones, ur: NEGATIVE mg/dL
Nitrite: NEGATIVE
Protein, ur: NEGATIVE mg/dL
Specific Gravity, Urine: 1.01 (ref 1.005–1.030)
pH: 6 (ref 5.0–8.0)

## 2023-08-20 LAB — CBC
HCT: 39.9 % (ref 36.0–46.0)
Hemoglobin: 12.8 g/dL (ref 12.0–15.0)
MCH: 26.8 pg (ref 26.0–34.0)
MCHC: 32.1 g/dL (ref 30.0–36.0)
MCV: 83.5 fL (ref 80.0–100.0)
Platelets: 271 10*3/uL (ref 150–400)
RBC: 4.78 MIL/uL (ref 3.87–5.11)
RDW: 15.1 % (ref 11.5–15.5)
WBC: 7.2 10*3/uL (ref 4.0–10.5)
nRBC: 0 % (ref 0.0–0.2)

## 2023-08-20 LAB — BASIC METABOLIC PANEL
Anion gap: 6 (ref 5–15)
BUN: 10 mg/dL (ref 6–20)
CO2: 25 mmol/L (ref 22–32)
Calcium: 8.9 mg/dL (ref 8.9–10.3)
Chloride: 107 mmol/L (ref 98–111)
Creatinine, Ser: 1.07 mg/dL — ABNORMAL HIGH (ref 0.44–1.00)
GFR, Estimated: 60 mL/min — ABNORMAL LOW (ref >60)
Glucose, Bld: 111 mg/dL — ABNORMAL HIGH (ref 70–99)
Potassium: 3.8 mmol/L (ref 3.5–5.1)
Sodium: 138 mmol/L (ref 135–145)

## 2023-08-20 MED ORDER — FLUTICASONE PROPIONATE 50 MCG/ACT NA SUSP: 1.0000 | Freq: Every day | NASAL | 0 refills | Status: DC

## 2023-08-20 MED ORDER — AMOXICILLIN-POT CLAVULANATE 875-125 MG PO TABS: 1.0000 | ORAL_TABLET | Freq: Two times a day (BID) | ORAL | 0 refills | Status: AC

## 2023-08-20 MED ORDER — MECLIZINE HCL 25 MG PO TABS: 25.0000 mg | ORAL_TABLET | Freq: Three times a day (TID) | ORAL | 0 refills | Status: DC | PRN

## 2023-08-20 NOTE — Telephone Encounter (Signed)
I spoke with pt; pt said symptoms started about 1 wk ago with lightheadedness, loss of balance,blurred vision, tightness in middle of chest on and off,heart fluttering with heart rate at 120. Pt does not have way to ck BP. Pt said she knew what she wanted to say but difficulty in getting words out. Pt has rt lower leg tightness but no redness or swelling. Pt rt arm hurts. Pt said she might feel little better today. Advised pt she needs to be seen and offered pt appt at [facility] since feeling better today but pt said she is going to get someone to take her to [facility] ED this morning. Sending note to Hayden Pedro FNP who is out of office and Dr Ermalene Searing who is in office

## 2023-08-20 NOTE — Telephone Encounter (Signed)
Noted and disposition appropriate

## 2023-08-20 NOTE — ED Provider Notes (Signed)
[facility] Provider Note    Event Date/Time   First MD Initiated Contact with Patient 08/20/23 1333     (approximate)   History   Dizziness and Hypertension   HPI  Shannon Hansen is a 57 y.o. female  here with primary c/o dizziness, loss of balance. Pt reports that for the past 1 week she has felt off balance, dizzy, and felt "off." Says she has felt like she has "fogginess" in her brain and has had some mild headaches, but also pressure in her b/l ears and sinuses. She has had some associated nasal drainage which is abnormal for her. No fever, chills. She has o/w been well, denies any recent med changes. No specific positional nature to her sx. No focal numbness or weakness. No speech difficulty.       Physical Exam   Triage Vital Signs: ED Triage Vitals  Encounter Vitals Group     BP 08/20/23 1146 (!) 143/85     Systolic BP Percentile --      Diastolic BP Percentile --      Pulse Rate 08/20/23 1146 77     Resp 08/20/23 1146 16     Temp 08/20/23 1146 98.9 F (37.2 C)     Temp Source 08/20/23 1146 Oral     SpO2 08/20/23 1146 94 %     Weight 08/20/23 1147 215 lb (97.5 kg)     Height 08/20/23 1147 5\' 7"  (1.702 m)     Head Circumference --      Peak Flow --      Pain Score 08/20/23 1147 3     Pain Loc --      Pain Education --      Exclude from Growth Chart --     Most recent vital signs: Vitals:   08/20/23 1146  BP: (!) 143/85  Pulse: 77  Resp: 16  Temp: 98.9 F (37.2 C)  SpO2: 94%     General: Awake, no distress.  CV:  Good peripheral perfusion. RRR. No m/r/g. Resp:  Normal work of breathing. Lungs clear bilaterally. Abd:  No distention. No tenderness. No rebound or guarding. Other:  CNII-XII intact, normal sensation to b/l UE and LE. Strength 5/5 bl UE and LE. Normal FTN and HTS. No dysmetria on FTN. Speech is normal.   Moderate bilateral nasal congestion and sinus tenderness over maxillary sinuses. No drainage. Moderate L>R TM  effusions with slight erythema of left TM. No perforation. No mastoid tenderness.   ED Results / Procedures / Treatments   Labs (all labs ordered are listed, but only abnormal results are displayed) Labs Reviewed  BASIC METABOLIC PANEL - Abnormal; Notable for the following components:      Result Value   Glucose, Bld 111 (*)    Creatinine, Ser 1.07 (*)    GFR, Estimated 60 (*)    All other components within normal limits  URINALYSIS, ROUTINE W REFLEX MICROSCOPIC - Abnormal; Notable for the following components:   Color, Urine YELLOW (*)    APPearance HAZY (*)    Hgb urine dipstick MODERATE (*)    Leukocytes,Ua TRACE (*)    Bacteria, UA MANY (*)    All other components within normal limits  CBC     EKG Normal sinus rhythm, VR 72. PR 158, QRS 136, QTc 470. No acute ST elevaitons. RBBB, no acute ST elevations.   RADIOLOGY CT Head: Normal head CT   I also independently reviewed and agree with  radiologist interpretations.   PROCEDURES:  Critical Care performed: No   MEDICATIONS ORDERED IN ED: Medications - No data to display   IMPRESSION / MDM / ASSESSMENT AND PLAN / ED COURSE  I reviewed the triage vital signs and the nursing notes.                              Differential diagnosis includes, but is not limited to, symptomatic HTN, sinusitis w/ aural fullness and possible acute OM on left, with peripheral vertigo component, less likely CVA, anemia, lyte abnormality  Patient's presentation is most consistent with acute presentation with potential threat to life or bodily function.  The patient is on the cardiac monitor to evaluate for evidence of arrhythmia and/or significant heart rate changes  57 yo F here with dizziness, mild sensation of confusion/fogginess, and sinus pressure. Suspect sinusitis with likely AOM, causing some dysequilibrium. No focal neuro deficits, no other signs of basilar or cerebellar abnormality and CT scan obtained, reviewed, and is  negative - do not suspect CVA, central etiology. CBC unremarkable. BMP normal. UA with mild pyuria, but pt has no UTI sx.   Given exam findings and reported sx c/w sinusitis and AOM, with reported h/o possible drainage from R ear this AM and improvement in sx (though Tm intact on exam), will tx for possible sinusitis/AOM with ABX, meclizine, and outpt f/u.     FINAL CLINICAL IMPRESSION(S) / ED DIAGNOSES   Final diagnoses:  Dizziness  Acute non-recurrent maxillary sinusitis     Rx / DC Orders   ED Discharge Orders          Ordered    amoxicillin-clavulanate (AUGMENTIN) 875-125 MG tablet  2 times daily        08/20/23 1437    fluticasone (FLONASE) 50 MCG/ACT nasal spray  Daily        08/20/23 1437    meclizine (ANTIVERT) 25 MG tablet  3 times daily PRN        08/20/23 1437             Note:  This document was prepared using Dragon voice recognition software and may include unintentional dictation errors.   Shaune Pollack, MD 08/20/23 2159

## 2023-08-20 NOTE — ED Triage Notes (Addendum)
Patient c/o unsteady on feet, dizziness, and hypertension X1 week. Spoke with PCP and told to come to ER.  Patient reports two falls recently.   Patient has been eating and drinking in lobby. Drove self to ER

## 2023-08-20 NOTE — Discharge Instructions (Signed)
Start taking the flonase or other over-the-counter STEROID nasal spray, daily, for 1 month  Start the antibiotic for possible sinus and ear infection  Take Meclizine (Antivert) for dizziness as needed  Drink plenty of fluids  Follow-up with your primary doctor in 1 week

## 2023-08-20 NOTE — Telephone Encounter (Signed)
Also agree. Thank you

## 2023-08-24 ENCOUNTER — Telehealth: Payer: Self-pay

## 2023-08-24 NOTE — Transitions of Care (Post Inpatient/ED Visit) (Signed)
Unable to reach pt by phone and left v/m requesting pt to cb [phone].       08/24/2023  Name: Shannon Hansen MRN: [id number] DOB: July 30, 1966  Today's TOC FU Call Status: Today's TOC FU Call Status:: Unsuccessful Call (1st Attempt) Unsuccessful Call (1st Attempt) Date: 08/24/23  Attempted to reach the patient regarding the most recent Inpatient/ED visit.  Follow Up Plan: Additional outreach attempts will be made to reach the patient to complete the Transitions of Care (Post Inpatient/ED visit) call.   Signature Lewanda Rife, LPN

## 2023-09-15 ENCOUNTER — Ambulatory Visit: Payer: 59 | Admitting: Podiatry

## 2023-09-23 ENCOUNTER — Ambulatory Visit: Payer: 59

## 2023-10-02 ENCOUNTER — Other Ambulatory Visit: Payer: Self-pay | Admitting: Family

## 2023-10-02 DIAGNOSIS — Z1211 Encounter for screening for malignant neoplasm of colon: Secondary | ICD-10-CM

## 2023-10-02 DIAGNOSIS — Z1212 Encounter for screening for malignant neoplasm of rectum: Secondary | ICD-10-CM

## 2023-10-05 ENCOUNTER — Other Ambulatory Visit (INDEPENDENT_AMBULATORY_CARE_PROVIDER_SITE_OTHER): Payer: 59 | Admitting: Family

## 2023-10-05 DIAGNOSIS — E039 Hypothyroidism, unspecified: Secondary | ICD-10-CM

## 2023-10-05 LAB — TSH: TSH: 0.71 u[IU]/mL (ref 0.35–5.50)

## 2023-10-06 NOTE — Progress Notes (Signed)
Thyroid is much better, reaching goal range. Let's continue with current dose.

## 2023-10-09 ENCOUNTER — Other Ambulatory Visit: Payer: Self-pay | Admitting: *Deleted

## 2023-10-09 DIAGNOSIS — E039 Hypothyroidism, unspecified: Secondary | ICD-10-CM

## 2023-10-09 MED ORDER — LEVOTHYROXINE SODIUM 112 MCG PO TABS: 112.0000 ug | ORAL_TABLET | Freq: Every day | ORAL | 2 refills | Status: DC

## 2023-10-30 ENCOUNTER — Emergency Department: Payer: 59

## 2023-10-30 ENCOUNTER — Emergency Department
Admission: EM | Admit: 2023-10-30 | Discharge: 2023-10-31 | Disposition: A | Discharge status: Home / Self Care | Payer: 59 | Attending: Emergency Medicine | Admitting: Emergency Medicine

## 2023-10-30 ENCOUNTER — Other Ambulatory Visit: Payer: Self-pay

## 2023-10-30 DIAGNOSIS — M25551 Pain in right hip: Secondary | ICD-10-CM | POA: Insufficient documentation

## 2023-10-30 DIAGNOSIS — E039 Hypothyroidism, unspecified: Secondary | ICD-10-CM | POA: Insufficient documentation

## 2023-10-30 DIAGNOSIS — Z7982 Long term (current) use of aspirin: Secondary | ICD-10-CM | POA: Insufficient documentation

## 2023-10-30 DIAGNOSIS — W19XXXA Unspecified fall, initial encounter: Secondary | ICD-10-CM | POA: Diagnosis not present

## 2023-10-30 DIAGNOSIS — F17200 Nicotine dependence, unspecified, uncomplicated: Secondary | ICD-10-CM | POA: Diagnosis not present

## 2023-10-30 DIAGNOSIS — Z79899 Other long term (current) drug therapy: Secondary | ICD-10-CM | POA: Diagnosis not present

## 2023-10-30 MED ORDER — OXYCODONE-ACETAMINOPHEN 5-325 MG PO TABS
1.0000 | ORAL_TABLET | ORAL | Status: DC | PRN
  Administered 2023-10-30: 1 via ORAL
  Filled 2023-10-30: qty 1

## 2023-10-30 NOTE — ED Triage Notes (Signed)
First nurse note: Pt here via [facility] from work, pt slipped banana peel, has R hip pain, no LOC. No deformity or shortening noted.   153/94 99% RA HR: 86

## 2023-10-30 NOTE — ED Triage Notes (Signed)
See first nurse note. 

## 2023-10-31 MED ORDER — OXYCODONE-ACETAMINOPHEN 5-325 MG PO TABS
1.0000 | ORAL_TABLET | ORAL | 0 refills | Status: DC | PRN
Start: 2023-10-31 — End: 2024-06-01

## 2023-10-31 NOTE — ED Provider Notes (Signed)
[facility] Provider Note    Event Date/Time   First MD Initiated Contact with Patient 10/30/23 2353     (approximate)   History   Hip Pain   HPI {Remember to add pertinent medical, surgical, social, and/or OB history to HPI:1} Shannon Hansen is a 57 y.o. 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  No BSO.   BREAST BIOPSY Right 12/29/2012   benign: fibroadenomatous changes stereotactic   PAROTIDECTOMY  10/29/1998   left/ Dr Renaee Munda     Home Medications   Prior to Admission medications   Medication Sig Start Date End Date Taking? Authorizing Provider  aspirin EC 81 MG tablet Take 81 mg by mouth every other day.    [provider]  ciclopirox (PENLAC) 8 % solution Apply topically at bedtime. Apply over nail and surrounding skin. Apply daily over previous coat. After seven (7) days, may remove with alcohol and continue cycle. 03/13/23   Candelaria Stagers, DPM  fluticasone (FLONASE) 50 MCG/ACT nasal spray Place 1 spray into both nostrils daily. 08/20/23 09/19/23  Shaune Pollack, MD  ibuprofen (ADVIL,MOTRIN) 200 MG tablet Take 200 mg by mouth every 6 (six) hours as needed for pain.    [provider]   levothyroxine (SYNTHROID) 112 MCG tablet Take 1 tablet (112 mcg total) by mouth daily. 10/09/23   Mort Sawyers, FNP  meclizine (ANTIVERT) 25 MG tablet Take 1 tablet (25 mg total) by mouth 3 (three) times daily as needed for dizziness. 08/20/23   Shaune Pollack, MD  meloxicam (MOBIC) 7.5 MG tablet Take 1 tablet (7.5 mg total) by mouth daily. 02/19/23   Mort Sawyers, FNP     Allergies  Patient has no known allergies.   Family History   Family History  Problem Relation Age of Onset   Hypertension Mother        labile HTN   Diabetes Mother    Thyroid disease Mother    Hyperlipidemia Mother    Heart disease Father        palpitations   Diabetes Brother    Ovarian cancer Maternal Grandmother    Pancreatic cancer Maternal Aunt      Physical Exam  Triage Vital Signs: ED Triage Vitals  Encounter Vitals Group     BP 10/30/23 1901 105/60     Systolic BP Percentile --      Diastolic BP Percentile --      Pulse Rate 10/30/23 1901 81     Resp 10/30/23 1901 17     Temp 10/30/23 1901 97.7 F (36.5 C)     Temp Source 10/30/23 1901 Oral     SpO2 10/30/23 1901 97 %  Weight --      Height --      Head Circumference --      Peak Flow --      Pain Score 10/30/23 1859 8     Pain Loc --      Pain Education --      Exclude from Growth Chart --     Updated Vital Signs: BP 105/60 (BP Location: Right Arm)   Pulse 81   Temp 97.7 F (36.5 C) (Oral)   Resp 17   SpO2 97%   {Only need to document appropriate and relevant physical exam:1} General: Awake, no distress. *** CV:  Good peripheral perfusion. *** Resp:  Normal effort. *** Abd:  No distention. *** Other:  ***   ED Results / Procedures / Treatments  Labs (all labs ordered are listed, but only abnormal results are displayed) Labs Reviewed - No data to display   EKG  ***   RADIOLOGY *** {You MUST document your own interpretation of imaging, as well as the fact that you reviewed the radiologist's  report!:1}  Official radiology report(s): DG Hip Unilat W or Wo Pelvis 2-3 Views Right  Result Date: 10/30/2023 CLINICAL DATA:  Fall and right hip pain. EXAM: DG HIP (WITH OR WITHOUT PELVIS) 2-3V RIGHT COMPARISON:  None Available. FINDINGS: There is no evidence of hip fracture or dislocation. There is no evidence of arthropathy or other focal bone abnormality. IMPRESSION: Negative. Electronically Signed   By: Elgie Collard M.D.   On: 10/30/2023 20:38     PROCEDURES:  Critical Care performed: {CriticalCareYesNo:19197::"Yes, see critical care procedure note(s)","No"}  Procedures   MEDICATIONS ORDERED IN ED: Medications  oxyCODONE-acetaminophen (PERCOCET/ROXICET) 5-325 MG per tablet 1 tablet (1 tablet Oral Given 10/30/23 2101)     IMPRESSION / MDM / ASSESSMENT AND PLAN / ED COURSE  I reviewed the triage vital signs and the nursing notes.                              Differential diagnosis includes, but is not limited to, ***  Patient's presentation is most consistent with {EM COPA:27473}  {If the patient is on the monitor, remove the brackets and asterisks on the sentence below and remember to document it as a Procedure as well. Otherwise delete the sentence below:1} {**The patient is on the cardiac monitor to evaluate for evidence of arrhythmia and/or significant heart rate changes.**}  {Remember to include, when applicable, any/all of the following data: independent review of imaging independent review of labs (comment specifically on pertinent positives and negatives) review of specific prior hospitalizations, PCP/specialist notes, etc. discuss meds given and prescribed document any discussion with consultants (including hospitalists) any clinical decision tools you used and why (PECARN, NEXUS, etc.) did you consider admitting the patient? document social determinants of health affecting patient's care (homelessness, inability to follow up in a timely fashion,  etc) document any pre-existing conditions increasing risk on current visit (e.g. diabetes and HTN increasing danger of high-risk chest pain/ACS) describes what meds you gave (especially parenteral) and why any other interventions?:1}      FINAL CLINICAL IMPRESSION(S) / ED DIAGNOSES   Final diagnoses:  Right hip pain     Rx / DC Orders   ED Discharge Orders     None        Note:  This document was prepared using Dragon voice recognition software and may include unintentional dictation errors.

## 2023-10-31 NOTE — Discharge Instructions (Signed)
You may take Ibuprofen as needed for pain; Percocet as needed for more severe pain.  Return to the ER for worsening symptoms or other concerns.

## 2023-11-05 ENCOUNTER — Telehealth: Payer: Self-pay

## 2023-11-05 NOTE — Transitions of Care (Post Inpatient/ED Visit) (Signed)
Unable to reach patient by phone and left v/m requesting call back at [phone].     11/05/2023  Name: Shannon Hansen MRN: [id number] DOB: 06/07/1966  Today's TOC FU Call Status: Today's TOC FU Call Status:: Unsuccessful Call (1st Attempt) Unsuccessful Call (1st Attempt) Date: 11/05/23  Attempted to reach the patient regarding the most recent Inpatient/ED visit.  Follow Up Plan: Additional outreach attempts will be made to reach the patient to complete the Transitions of Care (Post Inpatient/ED visit) call.   Signature  Lewanda Rife, LPN

## 2023-12-18 ENCOUNTER — Encounter (INDEPENDENT_AMBULATORY_CARE_PROVIDER_SITE_OTHER): Payer: Self-pay

## 2024-01-04 ENCOUNTER — Encounter: Payer: 59 | Admitting: Family

## 2024-01-25 ENCOUNTER — Encounter: Payer: Self-pay | Admitting: Family

## 2024-01-25 ENCOUNTER — Ambulatory Visit (INDEPENDENT_AMBULATORY_CARE_PROVIDER_SITE_OTHER): Payer: 59 | Admitting: Family

## 2024-01-25 ENCOUNTER — Ambulatory Visit: Admission: RE | Admit: 2024-01-25 | Payer: 59 | Source: Ambulatory Visit

## 2024-01-25 VITALS — BP 124/84 | HR 78 | Temp 97.8°F | Ht 67.0 in | Wt 220.8 lb

## 2024-01-25 DIAGNOSIS — R0781 Pleurodynia: Secondary | ICD-10-CM | POA: Insufficient documentation

## 2024-01-25 DIAGNOSIS — R944 Abnormal results of kidney function studies: Secondary | ICD-10-CM | POA: Diagnosis not present

## 2024-01-25 DIAGNOSIS — I8390 Asymptomatic varicose veins of unspecified lower extremity: Secondary | ICD-10-CM

## 2024-01-25 DIAGNOSIS — E039 Hypothyroidism, unspecified: Secondary | ICD-10-CM | POA: Diagnosis not present

## 2024-01-25 DIAGNOSIS — R7303 Prediabetes: Secondary | ICD-10-CM

## 2024-01-25 DIAGNOSIS — Z6833 Body mass index (BMI) 33.0-33.9, adult: Secondary | ICD-10-CM | POA: Diagnosis not present

## 2024-01-25 DIAGNOSIS — E66811 Obesity, class 1: Secondary | ICD-10-CM

## 2024-01-25 DIAGNOSIS — E782 Mixed hyperlipidemia: Secondary | ICD-10-CM

## 2024-01-25 DIAGNOSIS — E6609 Other obesity due to excess calories: Secondary | ICD-10-CM | POA: Diagnosis not present

## 2024-01-25 DIAGNOSIS — F17220 Nicotine dependence, chewing tobacco, uncomplicated: Secondary | ICD-10-CM

## 2024-01-25 DIAGNOSIS — F418 Other specified anxiety disorders: Secondary | ICD-10-CM | POA: Insufficient documentation

## 2024-01-25 LAB — BASIC METABOLIC PANEL
BUN: 11 mg/dL (ref 6–23)
CO2: 31 meq/L (ref 19–32)
Calcium: 9.5 mg/dL (ref 8.4–10.5)
Chloride: 100 meq/L (ref 96–112)
Creatinine, Ser: 1.15 mg/dL (ref 0.40–1.20)
GFR: 51.99 mL/min — ABNORMAL LOW (ref >60.00)
Glucose, Bld: 94 mg/dL (ref 70–99)
Potassium: 4.7 meq/L (ref 3.5–5.1)
Sodium: 138 meq/L (ref 135–145)

## 2024-01-25 LAB — LIPID PANEL
Cholesterol: 218 mg/dL — ABNORMAL HIGH (ref 0–200)
HDL: 56.6 mg/dL (ref >39.00)
LDL Cholesterol: 132 mg/dL — ABNORMAL HIGH (ref 0–99)
NonHDL: 161.08
Total CHOL/HDL Ratio: 4
Triglycerides: 145 mg/dL (ref 0.0–149.0)
VLDL: 29 mg/dL (ref 0.0–40.0)

## 2024-01-25 LAB — TSH: TSH: 1.13 u[IU]/mL (ref 0.35–5.50)

## 2024-01-25 LAB — HEMOGLOBIN A1C: Hgb A1c MFr Bld: 6.7 % — ABNORMAL HIGH (ref 4.6–6.5)

## 2024-01-25 MED ORDER — WEGOVY 0.5 MG/0.5ML ~~LOC~~ SOAJ
0.5000 mg | SUBCUTANEOUS | 0 refills | Status: DC
Start: 2024-02-15 — End: 2024-01-26

## 2024-01-25 MED ORDER — WEGOVY 0.25 MG/0.5ML ~~LOC~~ SOAJ
SUBCUTANEOUS | 0 refills | Status: DC
Start: 2024-01-25 — End: 2024-01-26

## 2024-01-25 NOTE — Progress Notes (Signed)
Established Patient Office Visit  Subjective:   Patient ID: Shannon Hansen, female    DOB: 11-08-1966  Age: 58 y.o. MRN: [id number]  CC:  Chief Complaint  Patient presents with   Medical Management of Chronic Issues    HPI: Shannon Hansen is a 58 y.o. female presenting on 01/25/2024 for Medical Management of Chronic Issues  Currently reports being depressed, dealing with a lot of stressors. Wasn't working for a while which contributed to her stress, has a friend helping her out with employment which has been helping a bit. Denies SI HI . This is on a daily basis. Not sleeping overly well as there is a lot on her mind.   About three weeks ago fell and slipped on thick ice. Fell onto the pavement and hit her left inner thumb, and had had a laceration which is now healed. However she is feeling it on her mid right back and under her right breast bone. Does hurt to take a deep breath.   Hypothyroid: on levothyroxine 112 mcg once daily. She does state she had a thyroid u/s in the past.        ROS: Negative unless specifically indicated above in HPI.   Relevant past medical history reviewed and updated as indicated.   Allergies and medications reviewed and updated.   Current Outpatient Medications:    aspirin EC 81 MG tablet, Take 81 mg by mouth every other day., Disp: , Rfl:    ciclopirox (PENLAC) 8 % solution, Apply topically at bedtime. Apply over nail and surrounding skin. Apply daily over previous coat. After seven (7) days, may remove with alcohol and continue cycle., Disp: 6.6 mL, Rfl: 0   ibuprofen (ADVIL,MOTRIN) 200 MG tablet, Take 200 mg by mouth every 6 (six) hours as needed for pain., Disp: , Rfl:    levothyroxine (SYNTHROID) 112 MCG tablet, Take 1 tablet (112 mcg total) by mouth daily., Disp: 30 tablet, Rfl: 2   meclizine (ANTIVERT) 25 MG tablet, Take 1 tablet (25 mg total) by mouth 3 (three) times daily as needed for dizziness., Disp: 30 tablet, Rfl: 0   meloxicam (MOBIC)  7.5 MG tablet, Take 1 tablet (7.5 mg total) by mouth daily., Disp: 90 tablet, Rfl: 3   oxyCODONE-acetaminophen (PERCOCET/ROXICET) 5-325 MG tablet, Take 1 tablet by mouth every 4 (four) hours as needed for severe pain (pain score 7-10)., Disp: 8 tablet, Rfl: 0   Semaglutide-Weight Management (WEGOVY) 0.25 MG/0.5ML SOAJ, Inject 0.25 mg [place] weekly for four weeks then increase to 0.5 mg weekly, Disp: 2 mL, Rfl: 0   [START ON 02/15/2024] Semaglutide-Weight Management (WEGOVY) 0.5 MG/0.5ML SOAJ, Inject 0.5 mg into the skin once a week., Disp: 2 mL, Rfl: 0  No Known Allergies  Objective:   BP 124/84 (BP Location: Left Arm, Patient Position: Sitting, Cuff Size: Normal)   Pulse 78   Temp 97.8 F (36.6 C) (Temporal)   Ht 5\' 7"  (1.702 m)   Wt 220 lb 12.8 oz (100.2 kg)   SpO2 99%   BMI 34.58 kg/m    Physical Exam Constitutional:      General: She is not in acute distress.    Appearance: Normal appearance. She is obese. She is not ill-appearing, toxic-appearing or diaphoretic.  HENT:     Head: Normocephalic.  Cardiovascular:     Rate and Rhythm: Normal rate and regular rhythm.  Pulmonary:     Effort: Pulmonary effort is normal.     Breath sounds: Normal breath sounds.  Musculoskeletal:        General: Normal range of motion.  Neurological:     General: No focal deficit present.     Mental Status: She is alert and oriented to person, place, and time. Mental status is at baseline.  Psychiatric:        Mood and Affect: Mood normal.        Behavior: Behavior normal.        Thought Content: Thought content normal.        Judgment: Judgment normal.     Assessment & Plan:  Rib pain on right side Assessment & Plan: Pt declines xray I have placed order in case pt changes her mind  Tylenol prn, heat to site prn   Orders: -     DG Ribs Unilateral W/Chest Right; Future  Decreased GFR -     Basic metabolic panel; Future  Acquired hypothyroidism Assessment & Plan: Cont levothyroxine 112  mcg once daily  Tsh today pending results   Orders: -     TSH; Future -     Wegovy; Inject 0.25 mg [place] weekly for four weeks then increase to 0.5 mg weekly  Dispense: 2 mL; Refill: 0  Prediabetes -     Hemoglobin A1c; Future -     Wegovy; Inject 0.25 mg [place] weekly for four weeks then increase to 0.5 mg weekly  Dispense: 2 mL; Refill: 0 -     ZOXWRU; Inject 0.5 mg into the skin once a week.  Dispense: 2 mL; Refill: 0  Class 1 obesity due to excess calories with serious comorbidity and body mass index (BMI) of 33.0 to 33.9 in adult Assessment & Plan: Pt advised to work on diet and exercise as tolerated Trial wegovy  The beneficiary does not have any FDA labeled contraindications to the requested agent including pregnancy, lactation, h/o medullary thyroid cancer or multiple endocrine neoplasia type II.       Orders: -     Hemoglobin A1c; Future -     Wegovy; Inject 0.25 mg [place] weekly for four weeks then increase to 0.5 mg weekly  Dispense: 2 mL; Refill: 0 -     EAVWUJ; Inject 0.5 mg into the skin once a week.  Dispense: 2 mL; Refill: 0  Depression with anxiety Assessment & Plan: Pt declines medication regimen at this time.  Referral placed for therapy   Orders: -     Ambulatory referral to Psychology  Mixed hyperlipidemia -     Lipid panel; Future -     Wegovy; Inject 0.25 mg [place] weekly for four weeks then increase to 0.5 mg weekly  Dispense: 2 mL; Refill: 0 -     WJXBJY; Inject 0.5 mg into the skin once a week.  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  Mort Sawyers, FNP

## 2024-01-25 NOTE — Assessment & Plan Note (Signed)
Pt to call to make appt with vascular has not yet scheduled

## 2024-01-25 NOTE — Assessment & Plan Note (Signed)
Smoking cessation instruction/counseling given:  counseled patient on the dangers of tobacco use, advised patient to stop smoking, and reviewed strategies to maximize success

## 2024-01-25 NOTE — Patient Instructions (Signed)
   Call vascular to get scheduled for appt [address], [place], [place] [address]  [phone]

## 2024-01-25 NOTE — Assessment & Plan Note (Signed)
Pt declines medication regimen at this time.  Referral placed for therapy

## 2024-01-25 NOTE — Assessment & Plan Note (Addendum)
Pt advised to work on diet and exercise as tolerated Trial wegovy  The beneficiary does not have any FDA labeled contraindications to the requested agent including pregnancy, lactation, h/o medullary thyroid cancer or multiple endocrine neoplasia type II.

## 2024-01-25 NOTE — Addendum Note (Signed)
Addended by: Mort Sawyers on: 01/25/2024 12:06 PM   Modules accepted: Orders

## 2024-01-25 NOTE — Assessment & Plan Note (Signed)
Pt declines xray I have placed order in case pt changes her mind  Tylenol prn, heat to site prn

## 2024-01-25 NOTE — Assessment & Plan Note (Signed)
Cont levothyroxine 112 mcg once daily  Tsh today pending results

## 2024-01-26 ENCOUNTER — Other Ambulatory Visit: Payer: Self-pay | Admitting: Family

## 2024-01-26 ENCOUNTER — Other Ambulatory Visit (HOSPITAL_COMMUNITY): Payer: Self-pay

## 2024-01-26 ENCOUNTER — Telehealth: Payer: Self-pay | Admitting: Pharmacist

## 2024-01-26 DIAGNOSIS — E119 Type 2 diabetes mellitus without complications: Secondary | ICD-10-CM | POA: Insufficient documentation

## 2024-01-26 MED ORDER — TIRZEPATIDE 5 MG/0.5ML ~~LOC~~ SOAJ: 5.0000 mg | SUBCUTANEOUS | 0 refills | Status: DC

## 2024-01-26 MED ORDER — TIRZEPATIDE 7.5 MG/0.5ML ~~LOC~~ SOAJ
7.5000 mg | SUBCUTANEOUS | 0 refills | Status: DC
Start: 2024-03-08 — End: 2024-06-01

## 2024-01-26 MED ORDER — TIRZEPATIDE 2.5 MG/0.5ML ~~LOC~~ SOAJ: 2.5000 mg | SUBCUTANEOUS | 0 refills | Status: DC

## 2024-01-26 NOTE — Telephone Encounter (Signed)
Pharmacy Patient Advocate Encounter  Received notification from [facility] that Prior Authorization for [facility] 2.5MG /0.5ML auto-injectors has been APPROVED from 01/26/2024 to 01/25/2025. Ran test claim, Copay is $25.00. This test claim was processed through [facility]- copay amounts may vary at other pharmacies due to pharmacy/plan contracts, or as the patient moves through the different stages of their insurance plan.   PA #/Case ID/Reference #: [id number]

## 2024-01-26 NOTE — Progress Notes (Signed)
Your cholesterol remains elevated Now in the diabetic range. Instead of wegovy I am going to try to get you started on a diabetic medication (such as ozempic, mounjaro, trulicity). Sending to pharmacy now, let me know if this is not filled or you have issues picking this up. Schedule 3 month follow up in office to repeat labs.  Kidneys remain stable.  Thyroid is behaving normal range. Stay at current dose (112)

## 2024-01-26 NOTE — Telephone Encounter (Signed)
Pharmacy Patient Advocate Encounter   Received notification from Physician's Office that prior authorization for Mounjaro 2.5MG /0.5ML auto-injectors is required/requested.   Insurance verification completed.   The patient is insured through [facility] .   Per test claim: PA required; PA submitted to above mentioned insurance via CoverMyMeds Key/confirmation #/EOC [id number] Status is pending

## 2024-01-27 ENCOUNTER — Other Ambulatory Visit (HOSPITAL_COMMUNITY): Payer: Self-pay

## 2024-01-29 ENCOUNTER — Encounter: Payer: Self-pay | Admitting: Family

## 2024-02-04 ENCOUNTER — Ambulatory Visit: Payer: Self-pay | Admitting: Family

## 2024-02-04 NOTE — Telephone Encounter (Addendum)
 Patient triaged for lab results and scheduled for her follow up by RN Shelvy Dickens. No need for duplicate.  Copied from CRM [id number]. Topic: Clinical - Lab/Test Results >> Feb 04, 2024 10:46 AM Ovid Blow wrote: Reason for CRM: Called to get lab results

## 2024-02-04 NOTE — Telephone Encounter (Signed)
 Scheduled patient 3 month follow up appt: per provider notes.

## 2024-02-04 NOTE — Telephone Encounter (Signed)
 Reason for Disposition . Health Information question, no triage required and triager able to answer question  Answer Assessment - Initial Assessment Questions 1. REASON FOR CALL or QUESTION: What is your reason for calling today? or How can I best help you? or What question do you have that I can help answer?     Pt called for lab results - nurse provided the following information per provider to patient:Your cholesterol remains elevated Now in the diabetic range. Instead of wegovy  I am going to try to get you started on a diabetic medication (such as ozempic, mounjaro , trulicity). Sending to pharmacy now, let me know if this is not filled or you have issues picking this up. Schedule 3 month follow up in office to repeat labs. Kidneys remain stable. Thyroid  is behaving normal range. Stay at current dose (112).  Patient verbalized understanding.  Protocols used: Information Only Call - No Triage-A-AH

## 2024-02-17 ENCOUNTER — Encounter: Payer: 59 | Admitting: Family

## 2024-02-22 ENCOUNTER — Other Ambulatory Visit: Payer: Self-pay | Admitting: *Deleted

## 2024-02-22 DIAGNOSIS — E039 Hypothyroidism, unspecified: Secondary | ICD-10-CM

## 2024-02-22 MED ORDER — LEVOTHYROXINE SODIUM 112 MCG PO TABS
112.0000 ug | ORAL_TABLET | Freq: Every day | ORAL | 5 refills | Status: DC
Start: 2024-02-22 — End: 2024-06-21

## 2024-02-29 LAB — COLOGUARD: COLOGUARD: NEGATIVE

## 2024-02-29 NOTE — Progress Notes (Signed)
 negative

## 2024-03-02 ENCOUNTER — Telehealth: Payer: Self-pay | Admitting: Family

## 2024-03-02 NOTE — Telephone Encounter (Signed)
 Pt is due for her mammogram. LM for pt to return call.

## 2024-03-03 ENCOUNTER — Encounter: Payer: Self-pay | Admitting: *Deleted

## 2024-03-22 ENCOUNTER — Encounter: Payer: 59 | Admitting: Family

## 2024-04-19 ENCOUNTER — Other Ambulatory Visit (INDEPENDENT_AMBULATORY_CARE_PROVIDER_SITE_OTHER): Payer: Self-pay | Admitting: Nurse Practitioner

## 2024-04-19 DIAGNOSIS — I8393 Asymptomatic varicose veins of bilateral lower extremities: Secondary | ICD-10-CM

## 2024-04-19 DIAGNOSIS — I739 Peripheral vascular disease, unspecified: Secondary | ICD-10-CM

## 2024-04-20 ENCOUNTER — Ambulatory Visit (INDEPENDENT_AMBULATORY_CARE_PROVIDER_SITE_OTHER)

## 2024-04-20 ENCOUNTER — Encounter (INDEPENDENT_AMBULATORY_CARE_PROVIDER_SITE_OTHER): Payer: Self-pay | Admitting: Nurse Practitioner

## 2024-04-20 ENCOUNTER — Ambulatory Visit (INDEPENDENT_AMBULATORY_CARE_PROVIDER_SITE_OTHER): Admitting: Nurse Practitioner

## 2024-04-20 VITALS — BP 138/80 | HR 74 | Resp 18 | Wt 223.6 lb

## 2024-04-20 DIAGNOSIS — I8393 Asymptomatic varicose veins of bilateral lower extremities: Secondary | ICD-10-CM

## 2024-04-20 DIAGNOSIS — E782 Mixed hyperlipidemia: Secondary | ICD-10-CM | POA: Diagnosis not present

## 2024-04-20 DIAGNOSIS — I8390 Asymptomatic varicose veins of unspecified lower extremity: Secondary | ICD-10-CM | POA: Diagnosis not present

## 2024-04-20 DIAGNOSIS — I739 Peripheral vascular disease, unspecified: Secondary | ICD-10-CM

## 2024-04-20 DIAGNOSIS — E119 Type 2 diabetes mellitus without complications: Secondary | ICD-10-CM

## 2024-04-25 ENCOUNTER — Encounter (INDEPENDENT_AMBULATORY_CARE_PROVIDER_SITE_OTHER): Payer: Self-pay | Admitting: Nurse Practitioner

## 2024-04-25 LAB — VAS US ABI WITH/WO TBI
Left ABI: 1.15
Right ABI: 1.29

## 2024-04-25 NOTE — Progress Notes (Signed)
 Subjective:    Patient ID: Shannon Hansen, female    DOB: 08-Apr-1966, 58 y.o.   MRN: [id number] Chief Complaint  Patient presents with   New Patient (Initial Visit)    Ref Dugal consult varicose veins in calf/intermittent claudication     The patient is seen for evaluation of symptomatic varicose veins. The patient relates burning and stinging which worsened steadily throughout the course of the day, particularly with standing. The patient also notes an aching and throbbing pain over the varicosities, particularly with prolonged dependent positions. The symptoms are significantly improved with elevation.  The patient also notes that during hot weather the symptoms are greatly intensified. The patient states the pain from the varicose veins interferes with work, daily exercise, shopping and household maintenance. At this point, the symptoms are persistent and severe enough that they're having a negative impact on lifestyle and are interfering with daily activities.  In addition she has also had some claudication-like symptoms  There is no history of DVT, PE or superficial thrombophlebitis. There is no history of ulceration or hemorrhage. The patient denies a significant family history of varicose veins.  The patient has not worn graduated compression in the past. At the present time the patient has not been using over-the-counter analgesics. There is no history of prior surgical intervention or sclerotherapy.  Today noninvasive studies show an ABI of 1.29 on the right and 1.15 on the left.  She has strong triphasic tibial artery waveforms and normal toe waveforms bilaterally.  Today patient's lower extremity venous reflux study notes no DVT bilaterally.  No evidence of deep venous insufficiency or superficial venous reflux noted bilaterally.    Review of Systems  Cardiovascular:  Positive for leg swelling.  All other systems reviewed and are negative.      Objective:   Physical  Exam Vitals reviewed.  HENT:     Head: Normocephalic.  Cardiovascular:     Rate and Rhythm: Normal rate.     Pulses: Normal pulses.  Pulmonary:     Effort: Pulmonary effort is normal.  Skin:    General: Skin is warm and dry.  Neurological:     Mental Status: She is alert and oriented to person, place, and time.  Psychiatric:        Mood and Affect: Mood normal.        Behavior: Behavior normal.        Thought Content: Thought content normal.        Judgment: Judgment normal.     BP 138/80   Pulse 74   Resp 18   Wt 223 lb 9.6 oz (101.4 kg)   BMI 35.02 kg/m   Past Medical History:  Diagnosis Date   Hypothyroid 2014    Social History   Socioeconomic History   Marital status: Married    Spouse name: Not on file   Number of children: 1   Years of education: Not on file   Highest education level: Not on file  Occupational History   Occupation: [employer]  Tobacco Use   Smoking status: Some Days    Types: Cigarettes    Passive exposure: Never   Smokeless tobacco: Current    Types: Chew   Tobacco comments:    Cutting back  Vaping Use   Vaping status: Never Used  Substance and Sexual Activity   Alcohol use: No   Drug use: No   Sexual activity: Yes    Partners: Male    Birth control/protection:  Surgical  Other Topics Concern   Not on file  Social History Narrative   Not on file   Social Drivers of Health   Financial Resource Strain: Not on file  Food Insecurity: Not on file  Transportation Needs: Not on file  Physical Activity: Not on file  Stress: Not on file  Social Connections: Not on file  Intimate Partner Violence: Not on file    Past Surgical History:  Procedure Laterality Date   ABDOMINAL HYSTERECTOMY  07/12/2004   partial hyst dysmennorhea Dr Juliann Ochoa.  No BSO.   BREAST BIOPSY Right 12/29/2012   benign: fibroadenomatous changes stereotactic   PAROTIDECTOMY  10/29/1998   left/ Dr Diana Forster    Family History  Problem Relation Age of Onset    Hypertension Mother        labile HTN   Diabetes Mother    Thyroid  disease Mother    Hyperlipidemia Mother    Heart disease Father        palpitations   Diabetes Brother    Ovarian cancer Maternal Grandmother    Pancreatic cancer Maternal Aunt     No Known Allergies     Latest Ref Rng & Units 08/20/2023   11:46 AM 04/29/2010    9:56 AM 04/05/2009   11:41 AM  CBC  WBC 4.0 - 10.5 K/uL 7.2  7.9  9.4   Hemoglobin 12.0 - 15.0 g/dL 40.9  81.1  91.4   Hematocrit 36.0 - 46.0 % 39.9  38.8  38.5   Platelets 150 - 400 K/uL 271  224.0  235.0       CMP     Component Value Date/Time   NA 138 01/25/2024 1041   K 4.7 01/25/2024 1041   CL 100 01/25/2024 1041   CO2 31 01/25/2024 1041   GLUCOSE 94 01/25/2024 1041   BUN 11 01/25/2024 1041   CREATININE 1.15 01/25/2024 1041   CALCIUM 9.5 01/25/2024 1041   PROT 6.7 04/29/2010 0956   ALBUMIN 3.8 04/29/2010 0956   AST 14 04/29/2010 0956   ALT 12 04/29/2010 0956   ALKPHOS 59 04/29/2010 0956   BILITOT 0.5 04/29/2010 0956   GFR 51.99 (L) 01/25/2024 1041   GFRNONAA 60 (L) 08/20/2023 1146     No results found.     Assessment & Plan:   1. Controlled type 2 diabetes mellitus without complication, without long-term current use of insulin (HCC) (Primary) Continue hypoglycemic medications as already ordered, these medications have been reviewed and there are no changes at this time.  Hgb A1C to be monitored as already arranged by primary service  2. Varicose veins of calf Recommend:  The patient is complaining of varicose veins.    I have had a long discussion with the patient regarding  varicose veins and why they cause symptoms.  Patient will begin wearing graduated compression stockings on a daily basis, beginning first thing in the morning and removing them in the evening. The patient is instructed specifically not to sleep in the stockings.    The patient  will also begin using over-the-counter analgesics such as Motrin 600 mg po  TID to help control the symptoms as needed.    In addition, behavioral modification including elevation during the day will be initiated, utilizing a recliner was recommended.  The patient is also instructed to continue exercising such as walking 4-5 times per week.   The Patient will follow up in 6 months  3. Mixed hyperlipidemia Continue statin as ordered and reviewed, no  changes at this time   Current Outpatient Medications on File Prior to Visit  Medication Sig Dispense Refill   aspirin EC 81 MG tablet Take 81 mg by mouth every other day.     ciclopirox  (PENLAC ) 8 % solution Apply topically at bedtime. Apply over nail and surrounding skin. Apply daily over previous coat. After seven (7) days, may remove with alcohol and continue cycle. 6.6 mL 0   ibuprofen (ADVIL,MOTRIN) 200 MG tablet Take 200 mg by mouth every 6 (six) hours as needed for pain.     levothyroxine  (SYNTHROID ) 112 MCG tablet Take 1 tablet (112 mcg total) by mouth daily. 30 tablet 5   meclizine  (ANTIVERT ) 25 MG tablet Take 1 tablet (25 mg total) by mouth 3 (three) times daily as needed for dizziness. 30 tablet 0   meloxicam  (MOBIC ) 7.5 MG tablet Take 1 tablet (7.5 mg total) by mouth daily. 90 tablet 3   oxyCODONE -acetaminophen  (PERCOCET/ROXICET) 5-325 MG tablet Take 1 tablet by mouth every 4 (four) hours as needed for severe pain (pain score 7-10). 8 tablet 0   tirzepatide (MOUNJARO) 2.5 MG/0.5ML Pen Inject 2.5 mg into the skin once a week. 2 mL 0   tirzepatide (MOUNJARO) 5 MG/0.5ML Pen Inject 5 mg into the skin once a week. 2 mL 0   tirzepatide (MOUNJARO) 7.5 MG/0.5ML Pen Inject 7.5 mg into the skin once a week. 6 mL 0   No current facility-administered medications on file prior to visit.    There are no Patient Instructions on file for this visit. No follow-ups on file.   Ruth Tully E [place], NP

## 2024-04-29 ENCOUNTER — Telehealth: Payer: Self-pay | Admitting: Family

## 2024-04-29 ENCOUNTER — Encounter: Payer: 59 | Admitting: Family

## 2024-04-29 NOTE — Telephone Encounter (Signed)
 Patient cancelled appt at check in stating she did not want to sign the AOB  Printed copies of AOB and physical exam statement that need signature. Patient refused to sign.    Copied from CRM [id number]. Topic: General - Other >> Apr 29, 2024 10:08 AM [place] J wrote: Reason for CRM: Patient would like to speak with a supervisor regarding a person matter. She did not give details but she is upset with something that she signed a while back and did not know what she was getting herself in to. Please call patient back.

## 2024-05-02 NOTE — Telephone Encounter (Signed)
 Called patient, no answer, no voicemail box set up, not on my chart

## 2024-06-01 ENCOUNTER — Encounter: Payer: Self-pay | Admitting: Family

## 2024-06-01 ENCOUNTER — Ambulatory Visit (INDEPENDENT_AMBULATORY_CARE_PROVIDER_SITE_OTHER): Admitting: Family

## 2024-06-01 ENCOUNTER — Telehealth: Payer: Self-pay | Admitting: Family

## 2024-06-01 VITALS — BP 120/78 | HR 68 | Temp 98.1°F | Ht 67.0 in | Wt 226.6 lb

## 2024-06-01 DIAGNOSIS — E039 Hypothyroidism, unspecified: Secondary | ICD-10-CM | POA: Diagnosis not present

## 2024-06-01 DIAGNOSIS — E119 Type 2 diabetes mellitus without complications: Secondary | ICD-10-CM

## 2024-06-01 DIAGNOSIS — M545 Low back pain, unspecified: Secondary | ICD-10-CM | POA: Diagnosis not present

## 2024-06-01 DIAGNOSIS — Z7982 Long term (current) use of aspirin: Secondary | ICD-10-CM

## 2024-06-01 DIAGNOSIS — G8929 Other chronic pain: Secondary | ICD-10-CM

## 2024-06-01 DIAGNOSIS — I8390 Asymptomatic varicose veins of unspecified lower extremity: Secondary | ICD-10-CM

## 2024-06-01 DIAGNOSIS — E782 Mixed hyperlipidemia: Secondary | ICD-10-CM | POA: Diagnosis not present

## 2024-06-01 DIAGNOSIS — R600 Localized edema: Secondary | ICD-10-CM

## 2024-06-01 DIAGNOSIS — F17220 Nicotine dependence, chewing tobacco, uncomplicated: Secondary | ICD-10-CM

## 2024-06-01 LAB — BASIC METABOLIC PANEL WITH GFR
BUN: 14 mg/dL (ref 6–23)
CO2: 29 meq/L (ref 19–32)
Calcium: 9.3 mg/dL (ref 8.4–10.5)
Chloride: 103 meq/L (ref 96–112)
Creatinine, Ser: 1.2 mg/dL (ref 0.40–1.20)
GFR: 49.28 mL/min — ABNORMAL LOW (ref >60.00)
Glucose, Bld: 94 mg/dL (ref 70–99)
Potassium: 3.9 meq/L (ref 3.5–5.1)
Sodium: 139 meq/L (ref 135–145)

## 2024-06-01 LAB — LIPID PANEL
Cholesterol: 200 mg/dL (ref 0–200)
HDL: 61.1 mg/dL (ref >39.00)
LDL Cholesterol: 118 mg/dL — ABNORMAL HIGH (ref 0–99)
NonHDL: 138.42
Total CHOL/HDL Ratio: 3
Triglycerides: 103 mg/dL (ref 0.0–149.0)
VLDL: 20.6 mg/dL (ref 0.0–40.0)

## 2024-06-01 LAB — T4, FREE: Free T4: 0.98 ng/dL (ref 0.60–1.60)

## 2024-06-01 LAB — TSH: TSH: 2.42 u[IU]/mL (ref 0.35–5.50)

## 2024-06-01 LAB — MICROALBUMIN / CREATININE URINE RATIO
Creatinine,U: 108.6 mg/dL
Microalb Creat Ratio: 16.6 mg/g (ref 0.0–30.0)
Microalb, Ur: 1.8 mg/dL (ref 0.0–1.9)

## 2024-06-01 LAB — HEMOGLOBIN A1C: Hgb A1c MFr Bld: 6.6 % — ABNORMAL HIGH (ref 4.6–6.5)

## 2024-06-01 MED ORDER — MELOXICAM 7.5 MG PO TABS
7.5000 mg | ORAL_TABLET | Freq: Every day | ORAL | 3 refills | Status: AC
Start: 2024-06-01 — End: ?

## 2024-06-01 MED ORDER — NICOTINE 21 MG/24HR TD PT24
21.0000 mg | MEDICATED_PATCH | Freq: Every day | TRANSDERMAL | 1 refills | Status: AC
Start: 2024-06-01 — End: ?

## 2024-06-01 MED ORDER — OMEPRAZOLE 20 MG PO CPDR
20.0000 mg | DELAYED_RELEASE_CAPSULE | Freq: Every day | ORAL | 3 refills | Status: AC
Start: 2024-06-01 — End: ?

## 2024-06-01 MED ORDER — HYDROCHLOROTHIAZIDE 12.5 MG PO CAPS: ORAL_CAPSULE | ORAL | 0 refills | Status: AC

## 2024-06-01 MED ORDER — HYDROCHLOROTHIAZIDE 12.5 MG PO CAPS
12.5000 mg | ORAL_CAPSULE | Freq: Every day | ORAL | 0 refills | Status: DC
Start: 2024-06-01 — End: 2024-06-01

## 2024-06-01 NOTE — Assessment & Plan Note (Signed)
 Discussed starting nicotine  patches 21 mg once daily in place of chewing tobacco  Smoking cessation instruction/counseling given:  counseled patient on the dangers of tobacco use, advised patient to stop smoking, and reviewed strategies to maximize success

## 2024-06-01 NOTE — Assessment & Plan Note (Signed)
 Ordered lipid panel, pending results. Work on low cholesterol diet and exercise as tolerated

## 2024-06-01 NOTE — Telephone Encounter (Signed)
 Called patient to make her aware of recommendations per provider. Unable to leave message, as no voicemail was set up and phone call continuously rung.   OK for E2C2 to relay note if patient calls back. If relayed, please notify the office.

## 2024-06-01 NOTE — Assessment & Plan Note (Signed)
 Refilled mobic  will start acid reducing medication as pt is on aspirin 81 mg  May also considering d/c this as pt self started this however she does chew tobacco so has increased risk for heart disease.   Advised avoid nsaids also while taking mobic  and on aspirin

## 2024-06-01 NOTE — Assessment & Plan Note (Signed)
 Wear compression stockings elevate legs encouraged exercise Monitor salt in diet.

## 2024-06-01 NOTE — Assessment & Plan Note (Signed)
 Tsh free t4 ordered Continue levothyroxine  125 mcg for now Was on 112 and symptomatic If too low will consider 112 mcg once daily with two tablets Saturday and Sunday  Pending results

## 2024-06-01 NOTE — Progress Notes (Signed)
 Established Patient Office Visit  Subjective:   Patient ID: Shannon Hansen, female    DOB: 1966-04-10  Age: 58 y.o. MRN: [id number]  CC:  Chief Complaint  Patient presents with   Thyroid  Problem   Weight Gain    Patient says she has noticed she has been noticing a weight gain. Patient says she has to restart her dosage of 112 mcg. Patient says she has gained at least 30-lbs in 3 months.    Foot Swelling   Foot Pain    HPI: Shannon Hansen is a 58 y.o. female presenting on 06/01/2024 for Thyroid  Problem, Weight Gain (Patient says she has noticed she has been noticing a weight gain. Patient says she has to restart her dosage of 112 mcg. Patient says she has gained at least 30-lbs in 3 months. ), Foot Swelling, and Foot Pain  Hypothyroid: was having some weight gain so she self increased her levothyroxine  112 mcg to 125 mcg once daily.   Diabetes: she never started mounjaro.   Varicose veins, starting to become more painful. She is noticing bumps on her lower legs as well, she has had some swelling along her left lateral ankle but is not always there, she thinks this has to do with the shoes she is wearing. With the weight gain she states that the legs have given her more bother. She was evaluated by vascular surgeon and was advised to wear compression stockings and symptom control with elevation of the leg. Also advised to continue exercising to help with circulation. Pedal edema, worse recently. She thinks this is her thyroid . She is not eating a high salt diet.   Chewing tobacco has cut down pretty significantly from prior. She uses about 1-2 packs a day. She is open to nicotine  patches.       Wt Readings from Last 3 Encounters:  06/01/24 226 lb 9.6 oz (102.8 kg)  04/20/24 223 lb 9.6 oz (101.4 kg)  01/25/24 220 lb 12.8 oz (100.2 kg)          ROS: Negative unless specifically indicated above in HPI.   Relevant past medical history reviewed and updated as indicated.   Allergies  and medications reviewed and updated.   Current Outpatient Medications:    aspirin EC 81 MG tablet, Take 81 mg by mouth every other day., Disp: , Rfl:    ciclopirox  (PENLAC ) 8 % solution, Apply topically at bedtime. Apply over nail and surrounding skin. Apply daily over previous coat. After seven (7) days, may remove with alcohol and continue cycle., Disp: 6.6 mL, Rfl: 0   ibuprofen (ADVIL,MOTRIN) 200 MG tablet, Take 200 mg by mouth every 6 (six) hours as needed for pain., Disp: , Rfl:    levothyroxine  (SYNTHROID ) 112 MCG tablet, Take 1 tablet (112 mcg total) by mouth daily., Disp: 30 tablet, Rfl: 5   nicotine  (NICODERM CQ  - DOSED IN MG/24 HOURS) 21 mg/24hr patch, Place 1 patch (21 mg total) onto the skin daily., Disp: 28 patch, Rfl: 1   omeprazole (PRILOSEC) 20 MG capsule, Take 1 capsule (20 mg total) by mouth daily., Disp: 30 capsule, Rfl: 3   hydrochlorothiazide (MICROZIDE) 12.5 MG capsule, Take one po every day prn edema, Disp: 30 capsule, Rfl: 0   meloxicam  (MOBIC ) 7.5 MG tablet, Take 1 tablet (7.5 mg total) by mouth daily., Disp: 90 tablet, Rfl: 3  No Known Allergies  Objective:   BP 120/78   Pulse 68   Temp 98.1 F (36.7 C) (Oral)  Ht 5\' 7"  (1.702 m)   Wt 226 lb 9.6 oz (102.8 kg)   SpO2 99%   BMI 35.49 kg/m    Physical Exam Vitals reviewed.  Constitutional:      General: She is not in acute distress.    Appearance: Normal appearance. She is normal weight. She is not ill-appearing, toxic-appearing or diaphoretic.  HENT:     Head: Normocephalic.  Neck:     Thyroid : No thyroid  mass, thyromegaly or thyroid  tenderness.  Cardiovascular:     Rate and Rhythm: Normal rate and regular rhythm.     Comments: Mild 1 + ankle edema non pitting Varicose veins evidenced non thrombosed  Pulmonary:     Effort: Pulmonary effort is normal.  Musculoskeletal:        General: Normal range of motion.  Neurological:     General: No focal deficit present.     Mental Status: She is alert  and oriented to person, place, and time. Mental status is at baseline.  Psychiatric:        Mood and Affect: Mood normal.        Behavior: Behavior normal.        Thought Content: Thought content normal.        Judgment: Judgment normal.     Assessment & Plan:  Acquired hypothyroidism Assessment & Plan: Tsh free t4 ordered Continue levothyroxine  125 mcg for now Was on 112 and symptomatic If too low will consider 112 mcg once daily with two tablets Saturday and Sunday  Pending results   Orders: -     TSH -     T4, free  Mixed hyperlipidemia Assessment & Plan: Ordered lipid panel, pending results. Work on low cholesterol diet and exercise as tolerated   Orders: -     Lipid panel  Controlled type 2 diabetes mellitus without complication, without long-term current use of insulin (HCC) Assessment & Plan: Bmp A1c and urine m/a ordered Pending results.  Work on diabetic diet and exercise as tolerated. Yearly foot exam, and annual eye exam.    Orders: -     Basic metabolic panel with GFR -     Hemoglobin A1c -     Microalbumin / creatinine urine ratio  Chronic bilateral low back pain without sciatica Assessment & Plan: Refilled mobic  will start acid reducing medication as pt is on aspirin 81 mg  May also considering d/c this as pt self started this however she does chew tobacco so has increased risk for heart disease.   Advised avoid nsaids also while taking mobic  and on aspirin    Orders: -     Meloxicam ; Take 1 tablet (7.5 mg total) by mouth daily.  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  Dispense: 28 patch; Refill: 1  Pedal edema -     hydroCHLOROthiazide; Take one po every day prn edema  Dispense: 30 capsule; Refill: 0  Varicose veins of calf Assessment & Plan: Wear compression stockings elevate legs encouraged exercise Monitor salt in diet.     Encounter for aspirin therapy -     Omeprazole; Take 1 capsule (  20 mg total) by mouth daily.  Dispense: 30 capsule; Refill: 3     Follow up plan: Return in about 1 month (around 07/01/2024) for f/u smoking cessation .  Felicita Horns, FNP

## 2024-06-01 NOTE — Telephone Encounter (Signed)
 Please call pt and let her know I added on a medication that will help coat her stomach and protect it from irritation from taking baby aspirin and the meloxicam  because of the increased bleeding risk with the concurrent use of one another.  She will just take this daily

## 2024-06-01 NOTE — Assessment & Plan Note (Signed)
 Bmp A1c and urine m/a ordered Pending results.  Work on diabetic diet and exercise as tolerated. Yearly foot exam, and annual eye exam.

## 2024-06-02 ENCOUNTER — Ambulatory Visit: Payer: Self-pay | Admitting: Family

## 2024-06-02 DIAGNOSIS — R944 Abnormal results of kidney function studies: Secondary | ICD-10-CM

## 2024-06-02 DIAGNOSIS — E66811 Other obesity due to excess calories: Secondary | ICD-10-CM

## 2024-06-02 DIAGNOSIS — N1831 Chronic kidney disease, stage 3a: Secondary | ICD-10-CM

## 2024-06-02 DIAGNOSIS — E039 Hypothyroidism, unspecified: Secondary | ICD-10-CM

## 2024-06-02 DIAGNOSIS — E119 Type 2 diabetes mellitus without complications: Secondary | ICD-10-CM

## 2024-06-02 MED ORDER — EMPAGLIFLOZIN 10 MG PO TABS: 10.0000 mg | ORAL_TABLET | Freq: Every day | ORAL | 3 refills | Status: AC

## 2024-06-02 NOTE — Telephone Encounter (Signed)
 Attempted to contact pt, there was no answer and her VM is not set up.

## 2024-06-02 NOTE — Progress Notes (Signed)
 Kidney function remains stable however it is on the lower number for age.   Have her schedule a kidney u/s just so that we can visualize kidneys and make sure nothing causing the decrease. Also diabetes will play a roll so we need to make sure the A1c is at goal range and we will continue monitoring this. A1c is at 6.6 stable from about four months ago but still not at goal < 6.5   I do advise we add on to the treatment plan. I want to start a medication pill called jardiance as they can also protect the kidneys and reduce heart disease progression which diabetes can contribute to. I want you to make sure though after peeing that you wipe really good and or squirt area with a water bottle to avoid uti's and or recurrent yeast infection. This is because jardiance works by having you pee out the sugar so it can sit on the area if not wiped all over and create the above.    Cholesterol also is at the higher end, but some improvement from last lab draw.   Surprisingly though your thyroid  level is ok. Let's continue at the 125 mcg dose for now but have you take one tablet once daily for six days, don't take on day seven so that we hopefully do not end up again at the lower range of things. Schedule a lab only appt in three months to repeat the thyroid  level too.    [facility] outpatient imaging center off [address], [place] [address] Phone [phone]  8-5 pm

## 2024-06-03 NOTE — Telephone Encounter (Signed)
 Attempted to contact pt, there was no answer and her VM is not set up.

## 2024-06-13 ENCOUNTER — Ambulatory Visit
Admission: RE | Admit: 2024-06-13 | Discharge: 2024-06-13 | Disposition: A | Discharge status: Home / Self Care | Source: Ambulatory Visit | Attending: Family | Admitting: Family

## 2024-06-13 ENCOUNTER — Telehealth: Payer: Self-pay | Admitting: Family

## 2024-06-13 DIAGNOSIS — R944 Abnormal results of kidney function studies: Secondary | ICD-10-CM | POA: Diagnosis present

## 2024-06-13 DIAGNOSIS — E039 Hypothyroidism, unspecified: Secondary | ICD-10-CM

## 2024-06-13 NOTE — Telephone Encounter (Signed)
 Copied from CRM [id number]. Topic: Clinical - Medication Question >> Jun 13, 2024 11:55 AM Bambi Bonine D wrote: Reason for CRM: Pt stated that she was informed that she will be taking levothyroxine  125 mg. Pt stated that she needs a prescription sent in for the 125 mg because the pharmacy already filled the 112 mg for 90 days. Pt would like a callback regarding this request.

## 2024-06-13 NOTE — Telephone Encounter (Signed)
 Attempted to contact pt, there was no answer and her VM was not set up. Will try back.

## 2024-06-14 ENCOUNTER — Ambulatory Visit: Payer: Self-pay | Admitting: Family

## 2024-06-14 NOTE — Telephone Encounter (Signed)
 Attempted to contact pt, there was no answer and her VM was not set up. Will try back.

## 2024-06-15 NOTE — Telephone Encounter (Signed)
 Attempted to contact pt, there was no answer and her VM was not set up.

## 2024-06-21 MED ORDER — LEVOTHYROXINE SODIUM 125 MCG PO TABS: ORAL_TABLET | ORAL | 0 refills | Status: DC

## 2024-06-21 NOTE — Telephone Encounter (Signed)
 Just to clarify, she is taking levothyroxine  125 mcg and not 112 mcg?  The 112 mcg dose is active on her medication list whereas 125 mcg is not.

## 2024-06-21 NOTE — Telephone Encounter (Signed)
 Yes, reviewed lab result note and office note as well.  Just needed clarification as the 112 mcg tablet is on medication list.  Refill provided for 125 mcg tablet.

## 2024-06-21 NOTE — Telephone Encounter (Signed)
 Yes ma'am; per 06/01/24 office note, 06/01/24 lab result note and what pt advised me she is presently taking is the Levothyroxine  125 mcg.

## 2024-06-21 NOTE — Addendum Note (Signed)
 Addended by: Davonne Jarnigan K on: 06/21/2024 05:09 PM   Modules accepted: Orders

## 2024-06-21 NOTE — Telephone Encounter (Signed)
 Pt said that she was told to take Levothyroxine  125 mcg taking one tab daily for 6 days and pt does not take on day 7.  This is how pt is presently taking Levothyroxine  125 mcg. Pt has 3 Levothyroxine  125 mcg left. Pt said she feels great.pt is requesting Levothyroxine  125 mcg sent to [facility]. Pt does not want to wait on T Dugal FNP return on 06/23/24 for med to be sent to pharmacy. Sending note to ONEIDA Patrick FNP who is out of office and MARLA Gaskins NP who is in office for review. Pt is also scheduled for CPX on 06/30/24 at 9:40 with T Dugal FNP. Pt said she thought that was cancelled since she saw Tabitha on 06/01/24.on the 06/01/24 note Tabitha wanted pt to return 1 month for FU of pt stopping smoking. Pt has not scheduled appt for 3 month repeat thyroid  lab until all questions are answered. Pt request cb after reviewed by providers.

## 2024-06-30 ENCOUNTER — Encounter: Payer: Self-pay | Admitting: Family

## 2024-06-30 ENCOUNTER — Ambulatory Visit
Admission: RE | Admit: 2024-06-30 | Discharge: 2024-06-30 | Disposition: A | Discharge status: Home / Self Care | Source: Ambulatory Visit | Attending: Family | Admitting: Family

## 2024-06-30 ENCOUNTER — Ambulatory Visit: Payer: Self-pay | Admitting: Family

## 2024-06-30 ENCOUNTER — Ambulatory Visit (INDEPENDENT_AMBULATORY_CARE_PROVIDER_SITE_OTHER): Admitting: Family

## 2024-06-30 VITALS — BP 138/84 | HR 67 | Temp 97.6°F | Ht 67.0 in | Wt 225.0 lb

## 2024-06-30 DIAGNOSIS — I8390 Asymptomatic varicose veins of unspecified lower extremity: Secondary | ICD-10-CM

## 2024-06-30 DIAGNOSIS — M79652 Pain in left thigh: Secondary | ICD-10-CM | POA: Diagnosis present

## 2024-06-30 NOTE — Assessment & Plan Note (Signed)
 Us  lower ext dvt r/o blood clot low suspicion however with newer onset pain and palpable mass will r/o Discussed ER precautions Continue aspirin  Otherwise suspect trauma, could consider heat prn tylenol  for pain prn

## 2024-06-30 NOTE — Patient Instructions (Signed)
   I have sent in your order electronically for the following: ultrasound lower extremity for DVT  at this location below. Please call to schedule the appointment at your convenience  [facility] outpatient imaging center [address], [place] [address] Phone [phone]  8-5 pm

## 2024-06-30 NOTE — Assessment & Plan Note (Signed)
 Cont f/u with vascular surgery  Asymptomatic varicose veins Advised to be consistent with compression stockings and elevation of legs Increase walking as very stagnant

## 2024-06-30 NOTE — Progress Notes (Signed)
 Established Patient Office Visit  Subjective:      CC:  Chief Complaint  Patient presents with   Annual Exam    Go over recent labs. Evaluate vein in left leg    HPI: Shannon Hansen is a 58 y.o. female presenting on 06/30/2024 for Annual Exam (Go over recent labs./Evaluate vein in left leg) . Has had chronic pain in the left lower extremity, she does sit often so not moving a lot, she states at times she has 'warmth' when she touches her leg intermittent of which has also been chronic. She notices redness in her lower legs at times as well but not today. She does have varicose veins of which are not painful. She does wear compression stockings daily. She has established with the vascular surgeon and she saw them last in April of 2025, she was advised to f/u in six months.  There is no pain in the calf while she walking.   She does state that upper thigh middle pain started about two weeks ago and is constant, the pain is intermittent, and worse with lying down and walking. She states that the pain stays in the same general area in the inner thigh and pain does not radiate. The pain is palpable as well when she touches the thigh.   DM2: most recent A1C at 6.6 started jardiance  6/5 10 mg she is tolerating well.  HLD: cholesterol higher than desired LDL 118 last labs 06/01/24 CKD, overall stable. Urine m/a negative. U/s kidney normal findings.      Social history:  Relevant past medical, surgical, family and social history reviewed and updated as indicated. Interim medical history since our last visit reviewed.  Allergies and medications reviewed and updated.  DATA REVIEWED: CHART IN EPIC     ROS: Negative unless specifically indicated above in HPI.    Current Outpatient Medications:    aspirin EC 81 MG tablet, Take 81 mg by mouth every other day., Disp: , Rfl:    ciclopirox  (PENLAC ) 8 % solution, Apply topically at bedtime. Apply over nail and surrounding skin. Apply daily  over previous coat. After seven (7) days, may remove with alcohol and continue cycle., Disp: 6.6 mL, Rfl: 0   empagliflozin  (JARDIANCE ) 10 MG TABS tablet, Take 1 tablet (10 mg total) by mouth daily before breakfast., Disp: 90 tablet, Rfl: 3   hydrochlorothiazide  (MICROZIDE ) 12.5 MG capsule, Take one po every day prn edema, Disp: 30 capsule, Rfl: 0   levothyroxine  (SYNTHROID ) 125 MCG tablet, Take 1 tablet by mouth every morning on an empty stomach with water only 6 days weekly.  No food or other medications for 30 minutes., Disp: 90 tablet, Rfl: 0   meloxicam  (MOBIC ) 7.5 MG tablet, Take 1 tablet (7.5 mg total) by mouth daily., Disp: 90 tablet, Rfl: 3   nicotine  (NICODERM CQ  - DOSED IN MG/24 HOURS) 21 mg/24hr patch, Place 1 patch (21 mg total) onto the skin daily., Disp: 28 patch, Rfl: 1   omeprazole  (PRILOSEC) 20 MG capsule, Take 1 capsule (20 mg total) by mouth daily., Disp: 30 capsule, Rfl: 3      Objective:    BP 138/84 (BP Location: Left Arm, Patient Position: Sitting, Cuff Size: Large)   Pulse 67   Temp 97.6 F (36.4 C) (Oral)   Ht 5' 7 (1.702 m)   Wt 225 lb (102.1 kg)   SpO2 96%   BMI 35.24 kg/m   Wt Readings from Last 3 Encounters:  06/30/24 225  lb (102.1 kg)  06/01/24 226 lb 9.6 oz (102.8 kg)  04/20/24 223 lb 9.6 oz (101.4 kg)    Physical Exam Constitutional:      General: She is not in acute distress.    Appearance: Normal appearance. She is normal weight. She is not ill-appearing, toxic-appearing or diaphoretic.  HENT:     Head: Normocephalic.  Cardiovascular:     Rate and Rhythm: Normal rate and regular rhythm.  Pulmonary:     Effort: Pulmonary effort is normal.     Breath sounds: Normal breath sounds.  Musculoskeletal:        General: Normal range of motion.     Right lower leg: No deformity.     Left lower leg: No swelling or deformity.     Comments: Slight palpable tenderness left inner thigh upper aspect  Small palpable area of a mass superficially    Neurological:     General: No focal deficit present.     Mental Status: She is alert and oriented to person, place, and time. Mental status is at baseline.  Psychiatric:        Mood and Affect: Mood normal.        Behavior: Behavior normal.        Thought Content: Thought content normal.        Judgment: Judgment normal.           Assessment & Plan:  Left thigh pain Assessment & Plan: Us  lower ext dvt r/o blood clot low suspicion however with newer onset pain and palpable mass will r/o Discussed ER precautions Continue aspirin  Otherwise suspect trauma, could consider heat prn tylenol  for pain prn  Orders: -     US  Venous Img Lower Unilateral Left (DVT); Future  Varicose veins of calf Assessment & Plan: Cont f/u with vascular surgery  Asymptomatic varicose veins Advised to be consistent with compression stockings and elevation of legs Increase walking as very stagnant       Return in about 3 months (around 09/30/2024) for f/u CPE.  Ginger Patrick, MSN, APRN, FNP-C [facility]

## 2024-09-06 ENCOUNTER — Other Ambulatory Visit: Payer: Self-pay | Admitting: Primary Care

## 2024-09-06 DIAGNOSIS — E039 Hypothyroidism, unspecified: Secondary | ICD-10-CM

## 2024-09-08 ENCOUNTER — Telehealth: Payer: Self-pay | Admitting: Family

## 2024-09-08 NOTE — Telephone Encounter (Unsigned)
 Copied from CRM #[id number]. Topic: Referral - Question >> Sep 07, 2024  1:18 PM Roselie BROCKS wrote: Reason for CRM: [facility] received Clearance  for patient today. And the fax says see attached form for medicine and conditions ,but there are not any other pages attached, and the Dental office needs the remaining forms.

## 2024-09-09 NOTE — Telephone Encounter (Signed)
 Copied from CRM [id number]. Topic: General - Other >> Sep 09, 2024 10:21 AM Berneda FALCON wrote: Reason for CRM:  [name] is calling for the medical clearance forms for the patient. They did receive it back, but it is missing the attached forms with the medication list and medical condition. Can we please resend that for them?  [phone] (Dental office) Fax-[phone]

## 2024-09-09 NOTE — Telephone Encounter (Signed)
 Documents have been faxed to [facility].

## 2024-10-14 ENCOUNTER — Encounter: Admitting: Family

## 2024-10-18 ENCOUNTER — Encounter: Admitting: Family

## 2024-10-20 ENCOUNTER — Ambulatory Visit (INDEPENDENT_AMBULATORY_CARE_PROVIDER_SITE_OTHER): Admitting: Nurse Practitioner

## 2024-12-07 ENCOUNTER — Ambulatory Visit: Admitting: Family

## 2024-12-14 ENCOUNTER — Ambulatory Visit: Admitting: Family

## 2024-12-14 ENCOUNTER — Encounter: Payer: Self-pay | Admitting: Family

## 2024-12-14 VITALS — BP 134/88 | HR 67 | Temp 98.0°F | Ht 67.0 in | Wt 224.0 lb

## 2024-12-14 DIAGNOSIS — N643 Galactorrhea not associated with childbirth: Secondary | ICD-10-CM | POA: Diagnosis not present

## 2024-12-14 DIAGNOSIS — E782 Mixed hyperlipidemia: Secondary | ICD-10-CM

## 2024-12-14 DIAGNOSIS — N6314 Unspecified lump in the right breast, lower inner quadrant: Secondary | ICD-10-CM

## 2024-12-14 DIAGNOSIS — Z0000 Encounter for general adult medical examination without abnormal findings: Secondary | ICD-10-CM | POA: Diagnosis not present

## 2024-12-14 DIAGNOSIS — Z114 Encounter for screening for human immunodeficiency virus [HIV]: Secondary | ICD-10-CM

## 2024-12-14 DIAGNOSIS — E1142 Type 2 diabetes mellitus with diabetic polyneuropathy: Secondary | ICD-10-CM

## 2024-12-14 DIAGNOSIS — E039 Hypothyroidism, unspecified: Secondary | ICD-10-CM | POA: Diagnosis not present

## 2024-12-14 DIAGNOSIS — Z1231 Encounter for screening mammogram for malignant neoplasm of breast: Secondary | ICD-10-CM

## 2024-12-14 DIAGNOSIS — E119 Type 2 diabetes mellitus without complications: Secondary | ICD-10-CM | POA: Diagnosis not present

## 2024-12-14 DIAGNOSIS — Z111 Encounter for screening for respiratory tuberculosis: Secondary | ICD-10-CM

## 2024-12-14 DIAGNOSIS — Z1211 Encounter for screening for malignant neoplasm of colon: Secondary | ICD-10-CM

## 2024-12-14 DIAGNOSIS — F418 Other specified anxiety disorders: Secondary | ICD-10-CM

## 2024-12-14 DIAGNOSIS — Z1159 Encounter for screening for other viral diseases: Secondary | ICD-10-CM

## 2024-12-14 LAB — COMPREHENSIVE METABOLIC PANEL WITH GFR
ALT: 13 U/L (ref 3–35)
AST: 15 U/L (ref 5–37)
Albumin: 4.2 g/dL (ref 3.5–5.2)
Alkaline Phosphatase: 99 U/L (ref 39–117)
BUN: 13 mg/dL (ref 6–23)
CO2: 29 meq/L (ref 19–32)
Calcium: 9.5 mg/dL (ref 8.4–10.5)
Chloride: 102 meq/L (ref 96–112)
Creatinine, Ser: 1.18 mg/dL (ref 0.40–1.20)
GFR: 50.09 mL/min — ABNORMAL LOW (ref >60.00)
Glucose, Bld: 88 mg/dL (ref 70–99)
Potassium: 4.2 meq/L (ref 3.5–5.1)
Sodium: 139 meq/L (ref 135–145)
Total Bilirubin: 0.5 mg/dL (ref 0.2–1.2)
Total Protein: 7.1 g/dL (ref 6.0–8.3)

## 2024-12-14 LAB — LIPID PANEL
Cholesterol: 225 mg/dL — ABNORMAL HIGH (ref 28–200)
HDL: 54.9 mg/dL (ref >39.00)
LDL Cholesterol: 141 mg/dL — ABNORMAL HIGH (ref 10–99)
NonHDL: 170.21
Total CHOL/HDL Ratio: 4
Triglycerides: 146 mg/dL (ref 10.0–149.0)
VLDL: 29.2 mg/dL (ref 0.0–40.0)

## 2024-12-14 LAB — HEMOGLOBIN A1C: Hgb A1c MFr Bld: 6.4 % (ref 4.6–6.5)

## 2024-12-14 LAB — TSH: TSH: 3.07 u[IU]/mL (ref 0.35–5.50)

## 2024-12-14 NOTE — Patient Instructions (Signed)
 I have sent an electronic order over to your preferred location for the following:   [x]   Diagnostic bilateral mammogram and right breast ultrasound   Please give this center a call to get scheduled at your convenience.  [x]   [facility]  [address] [place] [address]  [phone]  Make sure to wear two piece  clothing  No lotions powders or deodorants the day of the appointment Make sure to bring picture ID and insurance card.  Bring list of medications you are currently taking including any supplements.

## 2024-12-14 NOTE — Progress Notes (Signed)
 "  Subjective:  Patient ID: Shannon Hansen, female    DOB: [birth year]  Age: 58 y.o. MRN: [id number]  Patient Care Team: Corwin Antu, FNP as PCP - General (Family Medicine) Dessa, Reyes ORN, MD (General Surgery)   CC:  Chief Complaint  Patient presents with   Medical Management of Chronic Issues    HPI Shannon Hansen is a 58 y.o. female who presents today for an annual physical exam. She reports consuming a general diet. The patient does not participate in regular exercise at present. She generally feels well. She reports sleeping well. She does not have additional problems to discuss today.   Vision:Within last year Dental:Receives regular dental care  Mammogram: 01/22/22 Last pap: partial hysterectomy Colonoscopy:scheduled for 03/27/25  Pt is with acute concerns.   Discussed the use of AI scribe software for clinical note transcription with the patient, who gave verbal consent to proceed.  History of Present Illness Shannon Hansen is a 58 year old female who presents for an annual physical exam.  She experiences occasional dizziness, particularly upon getting out of bed, described as a 'floating' sensation. She attributes this to her medication, but notes that the dizziness has not persisted. She denies persistent dizziness, cough, unexplained fever, or extensive night sweats. She reports no history of abnormal breast masses.  She is actively working on reducing her use of chewing tobacco and is attempting to use a nicotine  patch regularly. She has never smoked cigarettes.  She has a history of a hysterectomy due to fibroids, with no history of cancer. She retains her ovaries and fallopian tubes but does not have a cervix or uterus.  She reports clear fluid discharge from her right breast for many years, described as 'like water.' She has had biopsies on both breasts in the past, which were negative for cancer.  She does not notice abdominal tenderness unless pressed during  examination. Occasional pain in her feet, described as a 'knot,' but denies burning sensation. She sometimes feels like she can't feel her feet, which she associates with her thyroid  condition.  She maintains her weight through daily efforts and is mindful of her health, including keeping up with eye and dental exams. Family history includes a relative with breast discharge, but no breast cancer.   Completed TB risk questionnaire as follows:  Were you born outside [place]  in [place], [place], [place], [place] or [place]? No.  Have you traveled outside of [place]  and lived for more than one month? No.   Do you have a compromised immune system such as from the following: HIV/AIDs, organ or bone  marrow transplant, diabetes, immunosuppressive medications, leukemia, lymphoma, cancer of head or neck, gastrectomy, or jejunal bypass, end stage renal disease on dialysis or silicosis? No.  Have you ever done one of the following? Used crack cocaine, injected illegal drugs, worked or resided in jail or prison, worked or resided at a homeless shelter, or worked as a research scientist (physical sciences) in careers information officer with patients? No.  Have you ever been exposed to anyone with infectious tb? No.  Completed TB symptom questionnaire as follows:   Do you currently have any of the following symptoms?  Unexplained cough more than 3 weeks? No. Unexplained fever lasting more than 3 weeks? No. Night sweats, that are leaving bedclothes and sheets wet? No. Shortness of breath? No. Chest pain? No. Unintentional weight loss? No. Unexplained fatigued? No.  Advanced Directives Patient does not have advanced directives    DEPRESSION SCREENING  12/14/2024    9:35 AM 06/30/2024    9:48 AM 02/19/2023    9:10 AM  PHQ 2/9 Scores  PHQ - 2 Score 0 0 2  PHQ- 9 Score 12  13      Data saved with a previous flowsheet row definition     ROS: Negative unless specifically indicated above in HPI.   Current  Medications[1]    Objective:    BP 134/88 (BP Location: Left Arm, Patient Position: Sitting, Cuff Size: Large)   Pulse 67   Temp 98 F (36.7 C) (Temporal)   Ht 5' 7 (1.702 m)   Wt 224 lb (101.6 kg)   SpO2 98%   BMI 35.08 kg/m   BP Readings from Last 3 Encounters:  12/14/24 134/88  06/30/24 138/84  06/01/24 120/78      Physical Exam Constitutional:      General: She is not in acute distress.    Appearance: Normal appearance. She is normal weight. She is not ill-appearing.  HENT:     Head: Normocephalic.     Right Ear: Tympanic membrane normal.     Left Ear: Tympanic membrane normal.     Nose: Nose normal.     Mouth/Throat:     Mouth: Mucous membranes are moist.  Eyes:     Extraocular Movements: Extraocular movements intact.     Pupils: Pupils are equal, round, and reactive to light.  Cardiovascular:     Rate and Rhythm: Normal rate and regular rhythm.  Pulmonary:     Effort: Pulmonary effort is normal.     Breath sounds: Normal breath sounds.  Chest:  Breasts:    Right: Mass present. No inverted nipple, nipple discharge (tenderness with slight mass palpated right lower inner quadrant), skin change or tenderness.     Left: No inverted nipple, nipple discharge, skin change or tenderness.     Comments: Bil densities palpated Abdominal:     General: Abdomen is flat. Bowel sounds are normal.     Palpations: Abdomen is soft.     Tenderness: There is no guarding or rebound.  Musculoskeletal:        General: Normal range of motion.     Cervical back: Normal range of motion.  Skin:    General: Skin is warm.     Capillary Refill: Capillary refill takes less than 2 seconds.  Neurological:     General: No focal deficit present.     Mental Status: She is alert.  Psychiatric:        Mood and Affect: Mood normal.        Behavior: Behavior normal.        Thought Content: Thought content normal.        Judgment: Judgment normal.      Lab Results  Component Value  Date   TSH 2.42 06/01/2024   Wt Readings from Last 3 Encounters:  12/14/24 224 lb (101.6 kg)  06/30/24 225 lb (102.1 kg)  06/01/24 226 lb 9.6 oz (102.8 kg)    Results       Assessment & Plan:   Assessment and Plan Assessment & Plan Type 2 diabetes mellitus Reports occasional foot pain and numbness, suggestive of diabetic neuropathy. - Ordered A1c and CMP to monitor diabetes control - Monitor for signs of neuropathy  Acquired hypothyroidism On levothyroxine  therapy. - Ordered TSH to monitor thyroid  function  Mixed hyperlipidemia - Ordered lipid panel to monitor lipid levels  Right breast mass with galactorrhea and tenderness Reports clear  discharge from right breast and tenderness. Previous biopsies were negative for cancer. Large breast size may contribute to symptoms. - Ordered diagnostic mammogram and breast ultrasound - Provided contact information for breast center to schedule mammogram  General Health Maintenance Due for routine screenings and vaccinations. Discussed importance of regular screenings and vaccinations, including mammogram, colonoscopy, eye exam, and vaccinations for HIV, hepatitis C, pneumonia, and shingles. - Ordered mammogram and colonoscopy - Ordered HIV and hepatitis C screening - Discussed pneumonia and shingles vaccines - Ordered TB blood test for work requirement      Additional ten min clinic time spent with acute concerns with ordering and physical exam In addition to physical     Follow-up: Return in about 6 months (around 06/14/2025) for f/u diabetes.   Ginger Patrick, FNP     [1]  Current Outpatient Medications:    aspirin EC 81 MG tablet, Take 81 mg by mouth every other day., Disp: , Rfl:    ciclopirox  (PENLAC ) 8 % solution, Apply topically at bedtime. Apply over nail and surrounding skin. Apply daily over previous coat. After seven (7) days, may remove with alcohol and continue cycle., Disp: 6.6 mL, Rfl: 0   empagliflozin   (JARDIANCE ) 10 MG TABS tablet, Take 1 tablet (10 mg total) by mouth daily before breakfast., Disp: 90 tablet, Rfl: 3   hydrochlorothiazide  (MICROZIDE ) 12.5 MG capsule, Take one po every day prn edema, Disp: 30 capsule, Rfl: 0   levothyroxine  (SYNTHROID ) 125 MCG tablet, TAKE 1 TABLET BY MOUTH EVERY MORNING ON AN EMPTY STOMACH WITH WATER ONLY 6 DAYS WEEKLY, NO FOOD OR OTHER MEDICATIONS FOR 30 MINUTES, Disp: 90 tablet, Rfl: 0   meloxicam  (MOBIC ) 7.5 MG tablet, Take 1 tablet (7.5 mg total) by mouth daily., Disp: 90 tablet, Rfl: 3   nicotine  (NICODERM CQ  - DOSED IN MG/24 HOURS) 21 mg/24hr patch, Place 1 patch (21 mg total) onto the skin daily., Disp: 28 patch, Rfl: 1   omeprazole  (PRILOSEC) 20 MG capsule, Take 1 capsule (20 mg total) by mouth daily., Disp: 30 capsule, Rfl: 3  "

## 2024-12-15 ENCOUNTER — Ambulatory Visit: Payer: Self-pay | Admitting: Family

## 2024-12-15 DIAGNOSIS — E782 Mixed hyperlipidemia: Secondary | ICD-10-CM

## 2024-12-15 DIAGNOSIS — Z79899 Other long term (current) drug therapy: Secondary | ICD-10-CM

## 2024-12-15 DIAGNOSIS — E039 Hypothyroidism, unspecified: Secondary | ICD-10-CM

## 2024-12-16 MED ORDER — ATORVASTATIN CALCIUM 40 MG PO TABS: 40.0000 mg | ORAL_TABLET | Freq: Every day | ORAL | 3 refills | Status: AC

## 2024-12-16 MED ORDER — LEVOTHYROXINE SODIUM 125 MCG PO TABS: ORAL_TABLET | ORAL | 0 refills | Status: AC

## 2024-12-18 LAB — QUANTIFERON-TB GOLD PLUS
Mitogen-NIL: 7.44 [IU]/mL
NIL: 0.02 [IU]/mL
QuantiFERON-TB Gold Plus: NEGATIVE
TB1-NIL: <0 [IU]/mL
TB2-NIL: <0 [IU]/mL

## 2024-12-18 LAB — HIV ANTIBODY (ROUTINE TESTING W REFLEX)
HIV 1&2 Ab, 4th Generation: NONREACTIVE
HIV FINAL INTERPRETATION: NEGATIVE

## 2024-12-18 LAB — HEPATITIS C ANTIBODY: Hepatitis C Ab: NONREACTIVE

## 2024-12-18 LAB — PROLACTIN: Prolactin: 3.6 ng/mL

## 2024-12-19 ENCOUNTER — Inpatient Hospital Stay: Admission: RE | Admit: 2024-12-19 | Discharge: 2024-12-19 | Attending: Family

## 2024-12-19 ENCOUNTER — Ambulatory Visit
Admission: RE | Admit: 2024-12-19 | Discharge: 2024-12-19 | Disposition: A | Discharge status: Home / Self Care | Source: Ambulatory Visit | Attending: Family | Admitting: Family

## 2024-12-19 DIAGNOSIS — N6314 Unspecified lump in the right breast, lower inner quadrant: Secondary | ICD-10-CM

## 2024-12-19 DIAGNOSIS — N643 Galactorrhea not associated with childbirth: Secondary | ICD-10-CM | POA: Diagnosis present

## 2024-12-20 ENCOUNTER — Telehealth: Payer: Self-pay

## 2024-12-20 ENCOUNTER — Other Ambulatory Visit: Payer: Self-pay

## 2024-12-20 DIAGNOSIS — Z1211 Encounter for screening for malignant neoplasm of colon: Secondary | ICD-10-CM

## 2024-12-20 MED ORDER — GOLYTELY 236 G PO SOLR: 4000.0000 mL | Freq: Once | ORAL | 0 refills | Status: AC

## 2024-12-20 NOTE — Progress Notes (Signed)
 noted

## 2024-12-20 NOTE — Telephone Encounter (Signed)
 Gastroenterology Pre-Procedure Review  Request Date: 03/27/25 Requesting Physician: Dr. Jinny  PATIENT REVIEW QUESTIONS: The patient responded to the following health history questions as indicated:    1. Are you having any GI issues? no 2. Do you have a personal history of Polyps?  3. Do you have a family history of Colon Cancer or Polyps? no 4. Diabetes Mellitus? yes (pt takes jardiance  has been advised to stop Jardiance  3 days prior) 5. Joint replacements in the past 12 months?no 6. Major health problems in the past 3 months?no 7. Any artificial heart valves, MVP, or defibrillator?no    MEDICATIONS & ALLERGIES:    Patient reports the following regarding taking any anticoagulation/antiplatelet therapy:   Plavix, Coumadin, Eliquis, Xarelto, Lovenox, Pradaxa, Brilinta, or Effient? no Aspirin? 81 mg daily  Patient confirms/reports the following medications:  Current Outpatient Medications  Medication Sig Dispense Refill   aspirin EC 81 MG tablet Take 81 mg by mouth every other day.     atorvastatin  (LIPITOR) 40 MG tablet Take 1 tablet (40 mg total) by mouth daily. 90 tablet 3   ciclopirox  (PENLAC ) 8 % solution Apply topically at bedtime. Apply over nail and surrounding skin. Apply daily over previous coat. After seven (7) days, may remove with alcohol and continue cycle. 6.6 mL 0   empagliflozin  (JARDIANCE ) 10 MG TABS tablet Take 1 tablet (10 mg total) by mouth daily before breakfast. 90 tablet 3   hydrochlorothiazide  (MICROZIDE ) 12.5 MG capsule Take one po every day prn edema 30 capsule 0   levothyroxine  (SYNTHROID ) 125 MCG tablet Take 1 tablet by mouth every morning on an empty stomach with water only 6 days weekly.  No food or other medications for 30 minutes. 90 tablet 0   meloxicam  (MOBIC ) 7.5 MG tablet Take 1 tablet (7.5 mg total) by mouth daily. 90 tablet 3   nicotine  (NICODERM CQ  - DOSED IN MG/24 HOURS) 21 mg/24hr patch Place 1 patch (21 mg total) onto the skin daily. 28 patch 1    omeprazole  (PRILOSEC) 20 MG capsule Take 1 capsule (20 mg total) by mouth daily. 30 capsule 3   No current facility-administered medications for this visit.    Patient confirms/reports the following allergies:  Allergies[1]  No orders of the defined types were placed in this encounter.   AUTHORIZATION INFORMATION Primary Insurance: 1D#: Group #:  Secondary Insurance: 1D#: Group #:  SCHEDULE INFORMATION: Date: 03/27/25 Time: Location: [facility]    [1] No Known Allergies

## 2024-12-25 ENCOUNTER — Encounter: Payer: Self-pay | Admitting: Family

## 2024-12-27 ENCOUNTER — Telehealth: Payer: Self-pay

## 2024-12-27 NOTE — Telephone Encounter (Signed)
 Pharmacy Patient Advocate Encounter   Received notification from Onbase that prior authorization for Mounjaro  2.5MG /0.5ML auto-injectors  is due for renewal.   Insurance verification completed.   The patient is insured through [facility].  Action: Medication has been discontinued. Archived Key: [id number]

## 2024-12-28 ENCOUNTER — Telehealth: Payer: Self-pay | Admitting: Family

## 2024-12-28 ENCOUNTER — Encounter: Payer: Self-pay | Admitting: Family

## 2024-12-28 NOTE — Telephone Encounter (Signed)
 Copied from CRM #[id number]. Topic: Clinical - Lab/Test Results >> Dec 28, 2024  9:39 AM Delon DASEN wrote: Reason for CRM: patient requesting copy of results of TB test, would like to pick up today- need print out

## 2024-12-28 NOTE — Telephone Encounter (Signed)
 Patient picked up form today.

## 2024-12-28 NOTE — Telephone Encounter (Signed)
 Called patent informed letter at reception with Quant gold results she will pick up today.

## 2025-03-14 ENCOUNTER — Other Ambulatory Visit

## 2025-03-27 ENCOUNTER — Ambulatory Visit: Admit: 2025-03-27 | Admitting: Gastroenterology

## 2025-03-27 SURGERY — COLONOSCOPY
Anesthesia: General

## 2025-04-12 ENCOUNTER — Ambulatory Visit (INDEPENDENT_AMBULATORY_CARE_PROVIDER_SITE_OTHER): Admitting: Nurse Practitioner
